# Patient Record
Sex: Male | Born: 1950 | Race: Black or African American | Hispanic: No | Marital: Married | Smoking: Former smoker
Health system: Southern US, Community
[De-identification: ages and names within clinical notes are randomized; demographics above are authoritative.]

## PROBLEM LIST (undated history)

## (undated) DIAGNOSIS — Z8619 Personal history of other infectious and parasitic diseases: Secondary | ICD-10-CM

## (undated) DIAGNOSIS — I10 Essential (primary) hypertension: Secondary | ICD-10-CM

## (undated) DIAGNOSIS — G4486 Cervicogenic headache: Secondary | ICD-10-CM

## (undated) DIAGNOSIS — K5909 Other constipation: Secondary | ICD-10-CM

## (undated) DIAGNOSIS — R7303 Prediabetes: Secondary | ICD-10-CM

## (undated) DIAGNOSIS — Z973 Presence of spectacles and contact lenses: Secondary | ICD-10-CM

## (undated) DIAGNOSIS — E785 Hyperlipidemia, unspecified: Secondary | ICD-10-CM

## (undated) HISTORY — DX: Hyperlipidemia, unspecified: E78.5

## (undated) HISTORY — PX: COLONOSCOPY WITH PROPOFOL: SHX5780

## (undated) HISTORY — DX: Essential (primary) hypertension: I10

## (undated) HISTORY — PX: OTHER SURGICAL HISTORY: SHX169

---

## 2002-10-06 ENCOUNTER — Encounter: Payer: Self-pay | Admitting: Family Medicine

## 2002-10-06 ENCOUNTER — Encounter: Admission: RE | Admit: 2002-10-06 | Discharge: 2002-10-06 | Payer: Self-pay | Admitting: Family Medicine

## 2015-04-26 ENCOUNTER — Ambulatory Visit
Admission: RE | Admit: 2015-04-26 | Discharge: 2015-04-26 | Disposition: A | Discharge status: Home / Self Care | Payer: BLUE CROSS/BLUE SHIELD | Source: Ambulatory Visit | Attending: Family Medicine | Admitting: Family Medicine

## 2015-04-26 ENCOUNTER — Other Ambulatory Visit: Payer: Self-pay | Admitting: Family Medicine

## 2015-04-26 DIAGNOSIS — R109 Unspecified abdominal pain: Secondary | ICD-10-CM

## 2015-08-15 ENCOUNTER — Other Ambulatory Visit: Payer: Self-pay | Admitting: Physician Assistant

## 2015-08-15 DIAGNOSIS — R1013 Epigastric pain: Secondary | ICD-10-CM

## 2015-08-21 ENCOUNTER — Other Ambulatory Visit: Payer: Self-pay | Admitting: Family Medicine

## 2015-08-21 DIAGNOSIS — R1084 Generalized abdominal pain: Secondary | ICD-10-CM

## 2015-08-23 ENCOUNTER — Ambulatory Visit
Admission: RE | Admit: 2015-08-23 | Discharge: 2015-08-23 | Disposition: A | Discharge status: Home / Self Care | Payer: BLUE CROSS/BLUE SHIELD | Source: Ambulatory Visit | Attending: Physician Assistant | Admitting: Physician Assistant

## 2015-08-23 ENCOUNTER — Ambulatory Visit
Admission: RE | Admit: 2015-08-23 | Discharge: 2015-08-23 | Disposition: A | Discharge status: Home / Self Care | Payer: BLUE CROSS/BLUE SHIELD | Source: Ambulatory Visit | Attending: Family Medicine | Admitting: Family Medicine

## 2015-08-23 DIAGNOSIS — R1084 Generalized abdominal pain: Secondary | ICD-10-CM

## 2015-08-23 DIAGNOSIS — R1013 Epigastric pain: Secondary | ICD-10-CM

## 2015-08-23 MED ORDER — IOPAMIDOL (ISOVUE-300) INJECTION 61%
125.0000 mL | Freq: Once | INTRAVENOUS | Status: AC | PRN
  Administered 2015-08-23: 125 mL via INTRAVENOUS

## 2016-08-12 ENCOUNTER — Encounter: Payer: Managed Care, Other (non HMO) | Attending: Family Medicine | Admitting: Registered"

## 2016-08-12 ENCOUNTER — Encounter: Payer: Self-pay | Admitting: Registered"

## 2016-08-12 DIAGNOSIS — Z713 Dietary counseling and surveillance: Secondary | ICD-10-CM | POA: Insufficient documentation

## 2016-08-12 DIAGNOSIS — R7303 Prediabetes: Secondary | ICD-10-CM | POA: Insufficient documentation

## 2016-08-12 NOTE — Progress Notes (Signed)
Medical Nutrition Therapy:  Appt start time: 0930 end time:  1030.   Assessment:  Primary concerns today: lifestyle changes to address pre-diabetes. Changes made since last appointment with doctor include increased physical activity and cutting back on carb intake. This patient is accompanied in the office by his spouse. Patient reports that he usually cooks dinner and they eat together when she gets home from work.   Preferred Learning Style:   No preference indicated   Learning Readiness:   Change in progress   MEDICATIONS: reviewed   DIETARY INTAKE:  Usual eating pattern includes 1-2 meals and 0-1 snacks per day.  24-hr recall:  B ( AM): none OR bacon eggs, sausage grits, toast with cream cheese OR cheerios, bananas Snk ( AM): none  L ( PM): none OR salad with grilled chicken Snk ( PM): pistachios D (6 PM): chicken green beans mac and cheese Snk ( PM): bananas OR yogurt with fruit Beverages: occasionally sweet tea 1x week, pepsi 2-3 times a week. Lemon water.  Usual physical activity: walking on the treadmill 2-3 week, has a fit bit to monitor steps and motivate activity.  Estimated energy needs: 2000 calories 225 g carbohydrates 150 g protein 56 g fat  Progress Towards Goal(s):  In progress.   Nutritional Diagnosis:  NI-5.8.4 Inconsistent carbohydrate intake As related to most calories and carbohydrates consumed in one meal.  As evidenced by dietary recall.    Intervention:  Nutrition Education for managing blood glucose with diet and lifestyle changes. Described diabetes. Stated some common risk factors for diabetes. Defined the role of glucose and insulin.  Identified type of diabetes and pathophysiology.  Described the relationship between diabetes and cardiovascular risk.  Described the role of different macronutrients on glucose.  Explained how carbohydrates affect blood glucose.  Stated what foods contain the most carbohydrates.  Demonstrated carbohydrate counting.   Demonstrated how to read Nutrition Facts food label. Practiced creating balanced meal.  Teaching Method Utilized:  Visual Auditory Hands on  Handouts given during visit include:  Living Well with Diabetes (copies)  Meal Plan Card  A1c handout  Portion Plate   Barriers to learning/adherence to lifestyle change: none  Demonstrated degree of understanding via:  Teach Back   Monitoring/Evaluation:  Dietary intake, exercise, and body weight prn.

## 2016-08-12 NOTE — Patient Instructions (Signed)
Plan:  Aim for 4-5 Carb Choices per meal (40-65 grams) +/- 1 either way  Aim for 1-2 Carbs per snack if hungry  Include protein in moderation with your meals and snacks Consider reading food labels for Total Carbohydrate  Continue phycial activity level daily as tolerated  Consider low sodium V-8 Consider reducing the amount of bacon in diet.  [email or web address] for restaurant nutrition facts.

## 2016-09-17 ENCOUNTER — Ambulatory Visit (INDEPENDENT_AMBULATORY_CARE_PROVIDER_SITE_OTHER): Payer: Managed Care, Other (non HMO) | Admitting: Neurology

## 2016-09-17 ENCOUNTER — Encounter: Payer: Self-pay | Admitting: Neurology

## 2016-09-17 VITALS — BP 130/82 | HR 90 | Ht 74.0 in | Wt 230.0 lb

## 2016-09-17 DIAGNOSIS — H93239 Hyperacusis, unspecified ear: Secondary | ICD-10-CM | POA: Diagnosis not present

## 2016-09-17 DIAGNOSIS — H8110 Benign paroxysmal vertigo, unspecified ear: Secondary | ICD-10-CM

## 2016-09-17 DIAGNOSIS — R42 Dizziness and giddiness: Secondary | ICD-10-CM | POA: Diagnosis not present

## 2016-09-17 DIAGNOSIS — R51 Headache: Secondary | ICD-10-CM

## 2016-09-17 DIAGNOSIS — G4486 Cervicogenic headache: Secondary | ICD-10-CM | POA: Insufficient documentation

## 2016-09-17 MED ORDER — CYCLOBENZAPRINE HCL 5 MG PO TABS: 2.5000 mg | ORAL_TABLET | Freq: Two times a day (BID) | ORAL | 5 refills | Status: DC

## 2016-09-17 NOTE — Progress Notes (Signed)
Note routed

## 2016-09-17 NOTE — Patient Instructions (Signed)
1.  Start flexeril 2.5mg  twice daily, if it does not make you too sleepy.  If you do not tolerate this, please call my office and we can switch to a different medication.  2.  Start neck physiotherapy  3.  If you develop new hearing changes or worsening headaches, call my office and we will schedule MRI brain.  Return to clinic in 4 months

## 2016-09-17 NOTE — Progress Notes (Signed)
[facility] Clinic Note - Initial Visit   Date: 09/17/16  Wise Fees MRN: [id number] DOB: [birth year]   Dear Dr. Manus Gunning:  Thank you for your kind referral of Julian Jimenez for consultation of dizziness and hyperacusis. Although his history is well known to you, please allow [place] to reiterate it for the purpose of our medical record. The patient was accompanied to the clinic by wife who also provides collateral information.     History of Present Illness: Julian Jimenez is a 66 y.o. right-handed African American male with hypertension and hyperlipidemia presenting for evaluation of dizziness.    Starting in mid-December 2017, he started having difficulty with imbalance and sensitivity to loud sounds.  For instance, if his wife scratches a lottery ticket to hits the table, the sound is very intense.  He often keeps the TV volume very low. He does not recall if hearing was affecting in both ears or lateralized to one side. This has improved over the past month and his hearing sensitivity is not as bad.  He also has spells of dizziness, which is triggered by head movement, and despite him moving, the objects in his vision keep moving.  Being still or closing his eyes, improves dizziness.  He denies nausea or vomiting.  He was given meclizine, which helps.   He does not have dizziness spell any more.   He has neck tightness especially over the right base of the head, which has been present for many years.  Pain is sore and achy and improved with neck ROM exercises.  He takes flexeril 5mg  at bedtime which helps, but makes him extremely sleepy.  Symptoms are improved with massage.  He has not tried ibuprofen or tylenol or physical therapy.  Headaches do not wake him up from sleeping or exacerbated by laughing, sneezing, or bearing down.   Past Medical History:  Diagnosis Date  . Hyperlipidemia   . Hypertension     Past Surgical History:  Procedure Laterality Date   . None       Medications:  Outpatient Encounter Prescriptions as of 09/17/2016  Medication Sig  . aspirin 81 MG chewable tablet Chew by mouth daily.  . cyclobenzaprine (FLEXERIL) 10 MG tablet Take 10 mg by mouth 3 (three) times daily as needed for muscle spasms.  Marland Kitchen lisinopril-hydrochlorothiazide (PRINZIDE,ZESTORETIC) 20-12.5 MG tablet Take 1 tablet by mouth daily.  . meclizine (ANTIVERT) 25 MG tablet   . simvastatin (ZOCOR) 20 MG tablet Take 20 mg by mouth daily.   No facility-administered encounter medications on file as of 09/17/2016.      Allergies: Not on File  Family History: Family History  Problem Relation Age of Onset  . Diabetes Mellitus II Mother   . Hypertension Mother   . Heart disease Father   . Diabetes Mellitus II Sister   . Hypertension Brother   . Hyperlipidemia Brother     Social History: Social History  Substance Use Topics  . Smoking status: Former Games developer  . Smokeless tobacco: Never Used  . Alcohol use Yes     Comment: One beer daily.    Social History   Social History Narrative   Retired Naval architect.   Lives with wife.  They have three children.   Highest level of education:  12th grade    Review of Systems:  CONSTITUTIONAL: No fevers, chills, night sweats, or weight loss.   EYES: No visual changes or eye pain ENT: No hearing changes.  No history of nose  bleeds.   RESPIRATORY: No cough, wheezing and shortness of breath.   CARDIOVASCULAR: Negative for chest pain, and palpitations.   GI: Negative for abdominal discomfort, blood in stools or black stools.  No recent change in bowel habits.   GU:  No history of incontinence.   MUSCLOSKELETAL: No history of joint pain or swelling.  No myalgias.   SKIN: Negative for lesions, rash, and itching.   HEMATOLOGY/ONCOLOGY: Negative for prolonged bleeding, bruising easily, and swollen nodes.  No history of cancer.   ENDOCRINE: Negative for cold or heat intolerance, polydipsia or goiter.   PSYCH:  No  depression or anxiety symptoms.   NEURO: As Above.   Vital Signs:  BP 130/82   Pulse 90   Ht 6\' 2"  (1.88 m)   Wt 230 lb (104.3 kg)   BMI 29.53 kg/m  Pain Scale: 0 on a scale of 0-10   General Medical Exam:   General:  Well appearing, comfortable.   Eyes/ENT: see cranial nerve examination.   Neck: No masses appreciated.  Full range of motion without tenderness.  No carotid bruits.  There is tenderness with palpation over the cervical paraspinal muscles.  Respiratory:  Clear to auscultation, good air entry bilaterally.   Cardiac:  Regular rate and rhythm, no murmur.   Extremities:  No deformities, edema, or skin discoloration.  Skin:  No rashes or lesions.  Neurological Exam: MENTAL STATUS including orientation to time, place, person, recent and remote memory, attention span and concentration, language, and fund of knowledge is normal.  Speech is not dysarthric.  CRANIAL NERVES: II:  No visual field defects.  Unremarkable fundi.   III-IV-VI: Pupils equal round and reactive to light.  Normal conjugate, extra-ocular eye movements in all directions of gaze.  No nystagmus.  No ptosis.  Head thrust negative. V:  Normal facial sensation.   VII:  Normal facial symmetry and movements.  No pathologic facial reflexes.  VIII:  Normal hearing and vestibular function.  Weber and Rinne testing is normal. IX-X:  Normal palatal movement.   XI:  Normal shoulder shrug and head rotation.   XII:  Normal tongue strength and range of motion, no deviation or fasciculation.  MOTOR:  Motor strength is 5/5 throughout.  No atrophy, fasciculations or abnormal movements.  No pronator drift.  Tone is normal.    MSRs:  Right                                                                 Left brachioradialis 2+  brachioradialis 2+  biceps 2+  biceps 2+  triceps 2+  triceps 2+  patellar 1+  patellar 1+  ankle jerk 1+  ankle jerk 1+  Hoffman no  Hoffman no  plantar response down  plantar response down    SENSORY:  Normal and symmetric perception of light touch, pinprick, vibration, and proprioception.  Romberg's sign absent.   COORDINATION/GAIT: Normal finger-to- nose-finger.  Intact rapid alternating movements bilaterally.  Able to rise from a chair without using arms.  Gait narrow based and stable. Tandem and stressed gait intact.    IMPRESSION: 1.  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  He is most bothered by his ongoing neck discomfort and stiffness.  He currently takes  flexeril 5mg  at bedtime, but is experiencing sedation so cannot increase the frequency.  I recommend that he start taking flexeril 2.5mg  twice daily and see if we can get away with less cognitive side effects.  He will also be started with neck physiotherapy.  2.  Benign paroxsymal positional vertigo is most likely given that occurs in spells, worse with head movements, and improves with meclizine.  His hyperacusis may be associated with vestibular disorder, such as BPPV.  Fortunately, he is no longer having these spells and is asymptomatic.  We discussed getting a MRI brain to be sure there is no intracranial pathology, but given that he is asymptomatic, the likelihood of a space-occupying lesion is low.  It was decided to hold on any imaging for now and if he developed new neurological symptoms, imaging can be ordered.   Return to clinic in 4 months.   The duration of this appointment visit was 50 minutes of face-to-face time with the patient.  Greater than 50% of this time was spent in counseling, explanation of diagnosis, planning of further management, and coordination of care.   Thank you for allowing me to participate in patient's care.  If I can answer any additional questions, I would be pleased to do so.    Sincerely,    Hamlet Lasecki K. Allena Katz, DO

## 2016-09-29 IMAGING — CR DG ABDOMEN 2V
2 series · 2 of 2 positions shown · non-contrast
Comparison: None

CLINICAL DATA: Upper abdominal and epigastric pain for 1 month,
decreased bowel movement frequency

EXAM:
ABDOMEN - 2 VIEW

[w abdomen upright *]
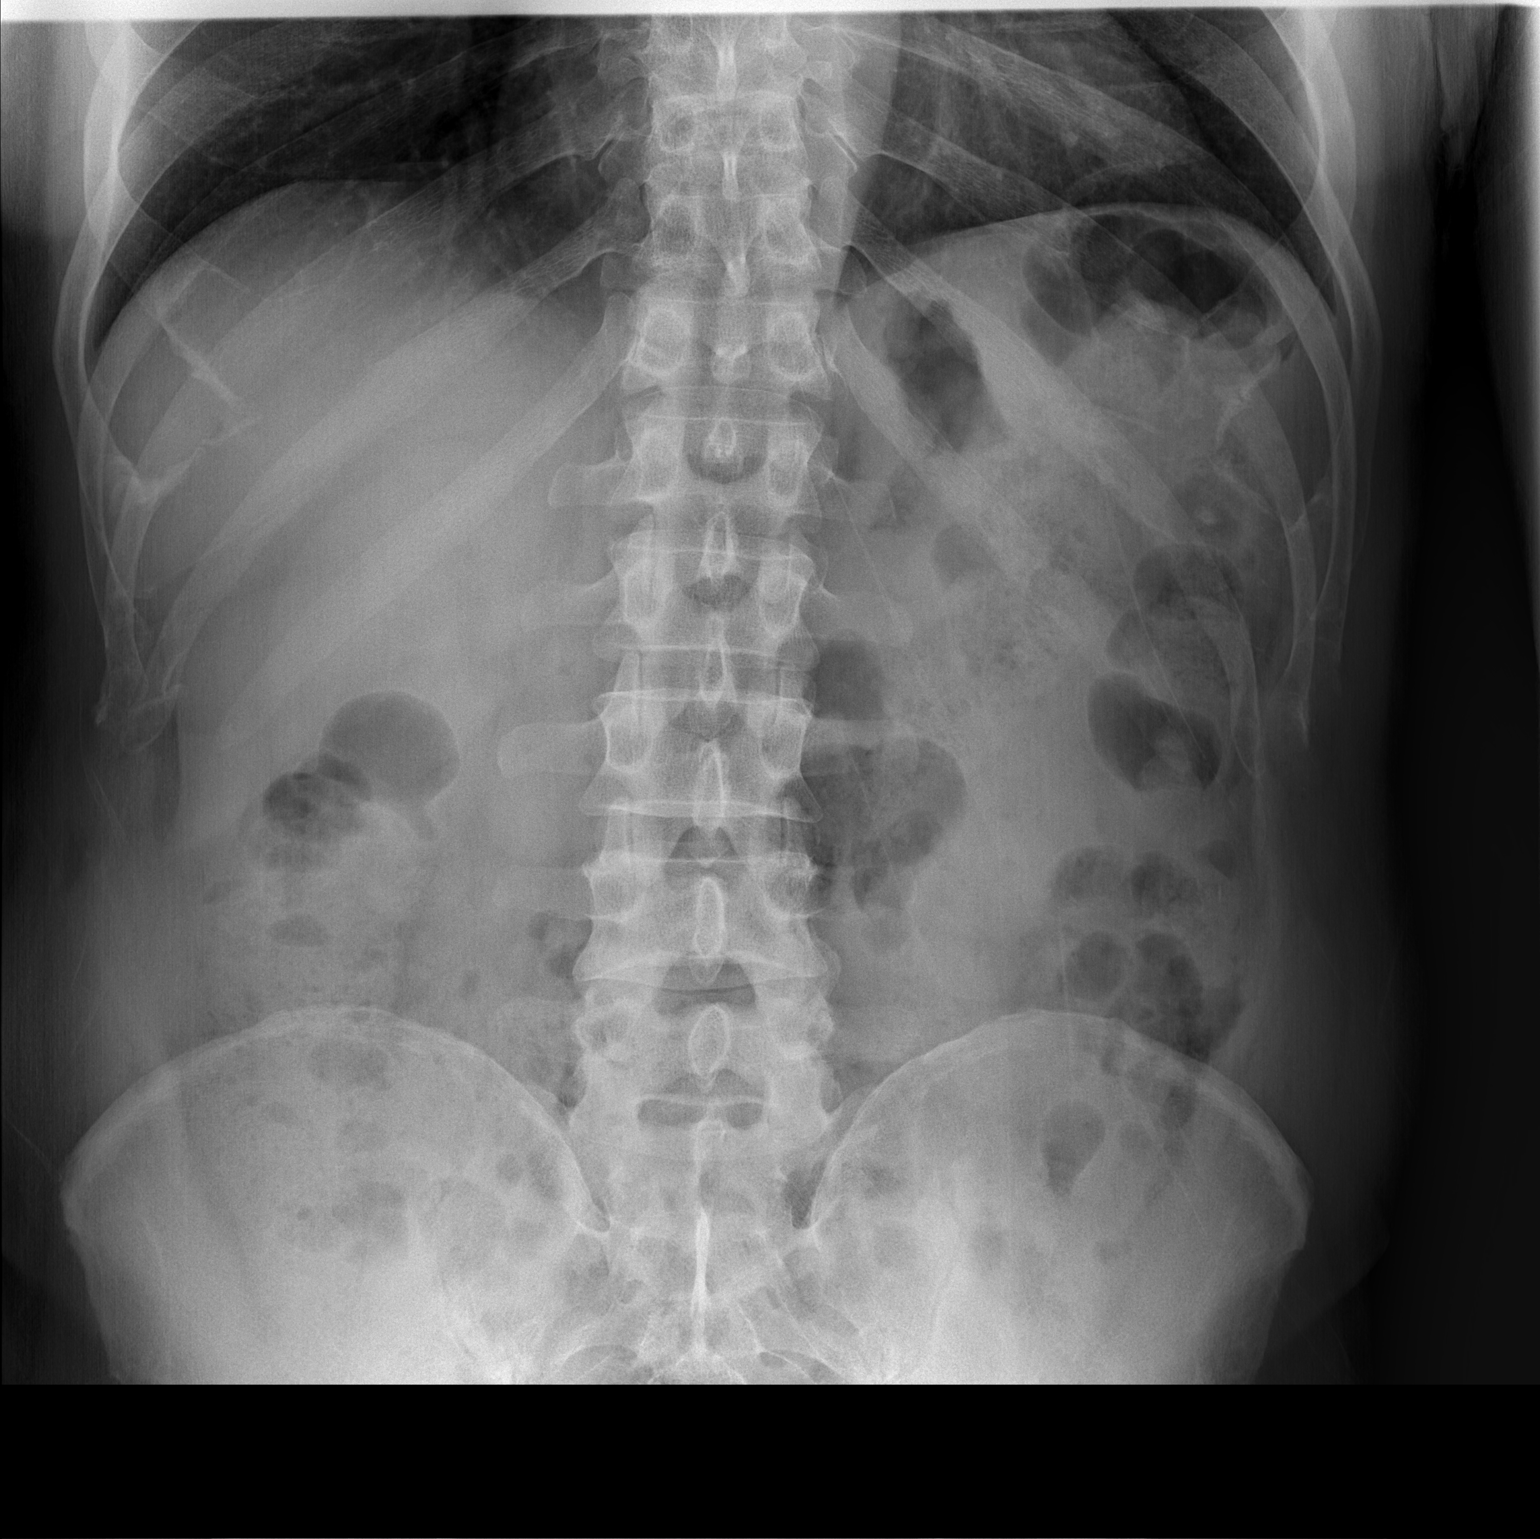

[t abdomen supine]
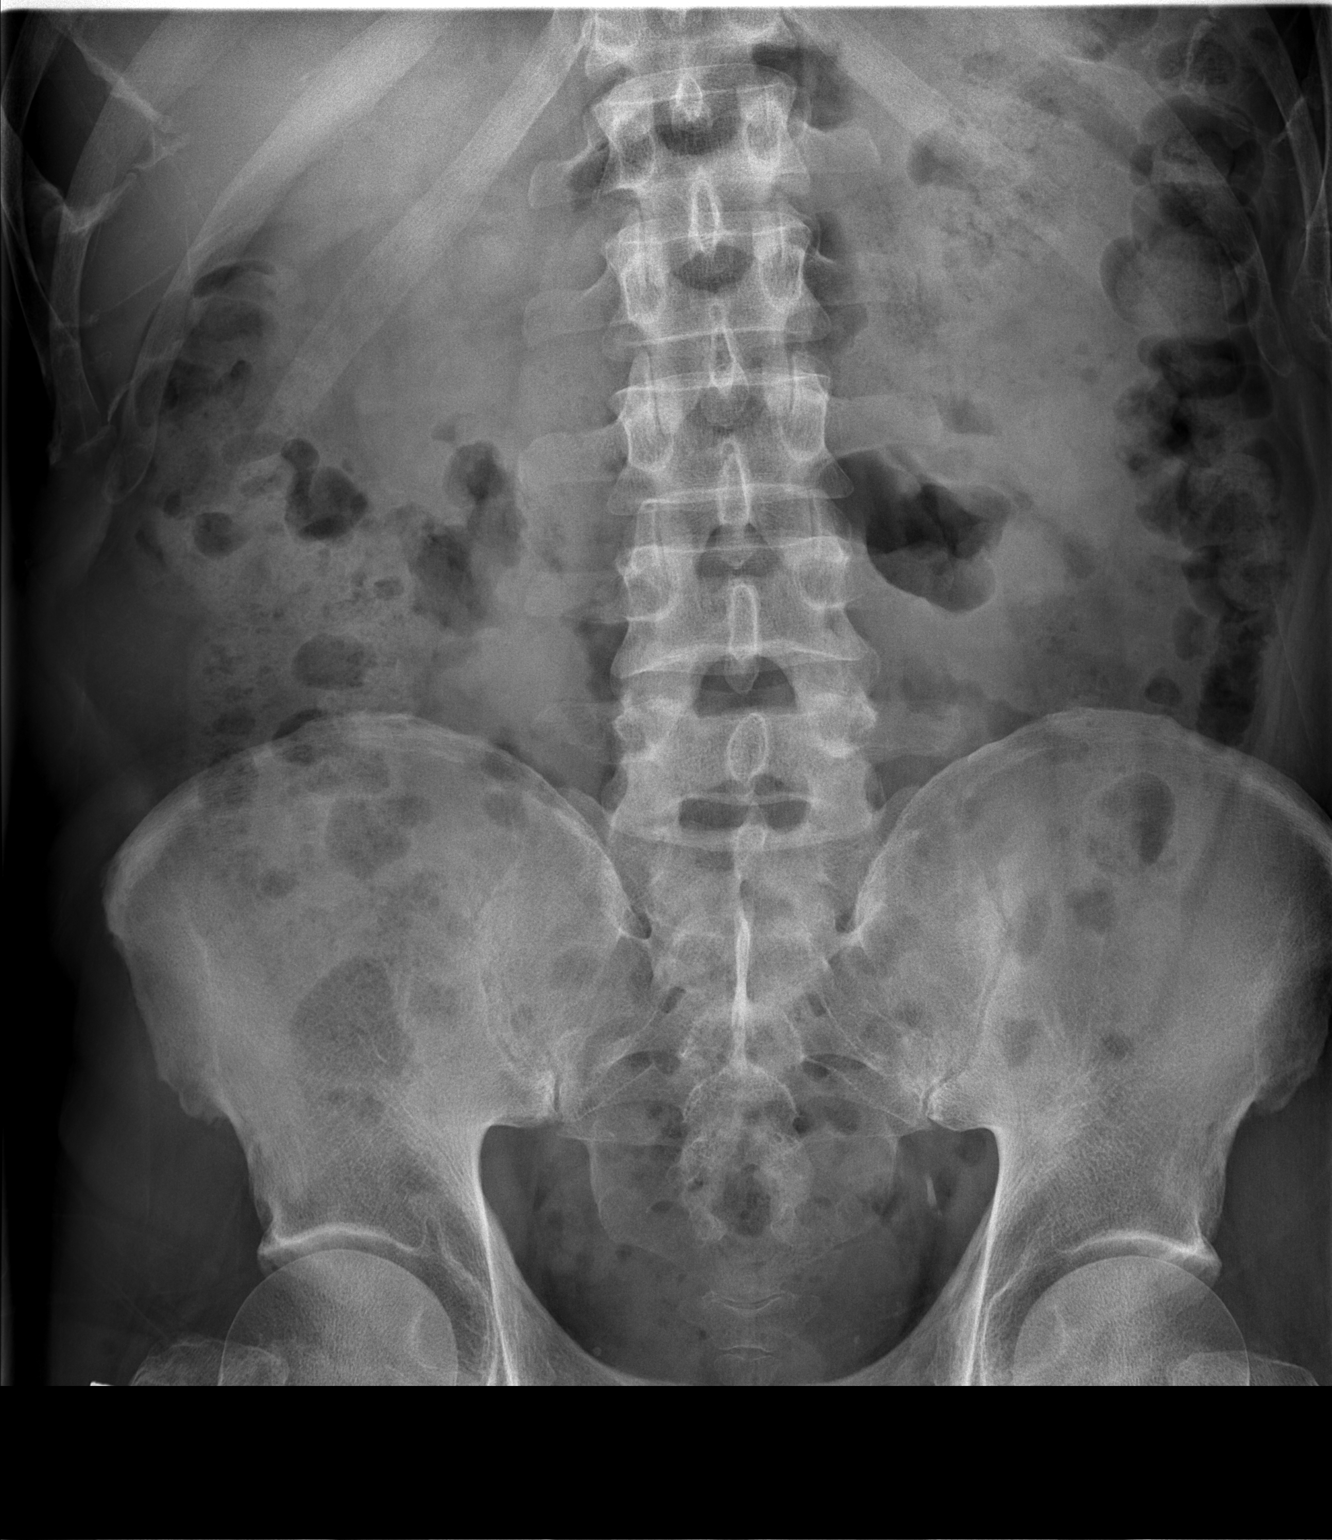

[2 of 2 positions shown; findings below may reference images not displayed]

FINDINGS: Normal bowel gas pattern.

No bowel dilatation, bowel wall thickening or free intraperitoneal
air.

Normal stool burden.

Lung bases clear.

Osseous structures normal.

No urinary tract calcifications.
IMPRESSION: Normal exam.

## 2017-01-14 ENCOUNTER — Ambulatory Visit (INDEPENDENT_AMBULATORY_CARE_PROVIDER_SITE_OTHER): Payer: Managed Care, Other (non HMO) | Admitting: Neurology

## 2017-01-14 ENCOUNTER — Encounter: Payer: Self-pay | Admitting: Neurology

## 2017-01-14 VITALS — BP 130/86 | HR 69 | Ht 74.0 in | Wt 228.2 lb

## 2017-01-14 DIAGNOSIS — R51 Headache: Secondary | ICD-10-CM

## 2017-01-14 DIAGNOSIS — H8110 Benign paroxysmal vertigo, unspecified ear: Secondary | ICD-10-CM | POA: Diagnosis not present

## 2017-01-14 DIAGNOSIS — G4486 Cervicogenic headache: Secondary | ICD-10-CM

## 2017-01-14 NOTE — Progress Notes (Signed)
Follow-up Visit   Date: 01/14/17    Julian SquibbWardell Blundell MRN: [id number] DOB: September 11, 1950   Interim History: Julian Jimenez is a 66 y.o. right-handed  right-handed African American male with hypertension and hyperlipidemia returning to the clinic for follow-up of cervicogenic dizziness.  The patient was accompanied to the clinic by self.  History of present illness: Initial visit 09/17/2016 for dizziness:  Starting in mid-December 2017, he started having difficulty with imbalance and sensitivity to loud sounds.  For instance, if his wife scratches a lottery ticket to hits the table, the sound is very intense.  He often keeps the TV volume very low. He does not recall if hearing was affecting in both ears or lateralized to one side. This has improved over the past month and his hearing sensitivity is not as bad.  He also has spells of dizziness, which is triggered by head movement, and despite him moving, the objects in his vision keep moving.  Being still or closing his eyes, improves dizziness.  He denies nausea or vomiting.  He was given meclizine, which helps.   He does not have dizziness spell any more.   He has neck tightness especially over the right base of the head, which has been present for many years.  Pain is sore and achy and improved with neck ROM exercises.  He takes flexeril 5mg  at bedtime which helps, but makes him extremely sleepy.  Symptoms are improved with massage.  He has not tried ibuprofen or tylenol or physical therapy.  Headaches do not wake him up from sleeping or exacerbated by laughing, sneezing, or bearing down.  UPDATE 01/14/2017:  He is here for 4 month follow-up.  He has been taking flexeril 2.5mg  at bedtime and was able to reduce the frequency to every 3 days and noticed that headaches and dizziness are improved, but he continues to have some mild stiffness of the neck. He did not receive a call for neck PT, so did not go, but nevertheless, is doing much better.  No  new complaints. He has not had any more spells of vertigo.  Medications:  Current Outpatient Prescriptions on File Prior to Visit  Medication Sig Dispense Refill  . aspirin 81 MG chewable tablet Chew by mouth daily.    . cyclobenzaprine (FLEXERIL) 5 MG tablet Take 0.5 tablets (2.5 mg total) by mouth 2 (two) times daily. 30 tablet 5  . lisinopril-hydrochlorothiazide (PRINZIDE,ZESTORETIC) 20-12.5 MG tablet Take 1 tablet by mouth daily.    . meclizine (ANTIVERT) 25 MG tablet     . simvastatin (ZOCOR) 20 MG tablet Take 20 mg by mouth daily.     No current facility-administered medications on file prior to visit.     Allergies: Not on File  Review of Systems:  CONSTITUTIONAL: No fevers, chills, night sweats, or weight loss.  EYES: No visual changes or eye pain ENT: No hearing changes.  No history of nose bleeds.   RESPIRATORY: No cough, wheezing and shortness of breath.   CARDIOVASCULAR: Negative for chest pain, and palpitations.   GI: Negative for abdominal discomfort, blood in stools or black stools.  No recent change in bowel habits.   GU:  No history of incontinence.   MUSCLOSKELETAL: No history of joint pain or swelling.  No myalgias.   SKIN: Negative for lesions, rash, and itching.   ENDOCRINE: Negative for cold or heat intolerance, polydipsia or goiter.   PSYCH:  No depression or anxiety symptoms.   NEURO: As Above.  Vital Signs:  BP 130/86   Pulse 69   Ht 6\' 2"  (1.88 m)   Wt 228 lb 3 oz (103.5 kg)   SpO2 98%   BMI 29.30 kg/m   Neurological Exam: MENTAL STATUS including orientation to time, place, person, recent and remote memory, attention span and concentration, language, and fund of knowledge is normal.  Speech is not dysarthric.  CRANIAL NERVES:  Pupils equal round and reactive to light.  Normal conjugate, extra-ocular eye movements in all directions of gaze.  No ptosis or nystagmus. Face is symmetric.   MOTOR:  Motor strength is 5/5 in all  extremities  COORDINATION/GAIT:  Normal finger-to- nose-finger.  Intact rapid alternating movements bilaterally.  Gait narrow based and stable.   Data: n/a  IMPRESSION/PLAN: 1.  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  Clinically, improved with low dose flexeril 2.5mg  at bedtime.  Since he is tolerating this better, he may increase this to 5mg  at bedtime as needed.  2.  Benign paroxsymal positional vertigo is most likely given that occurs in spells, worse with head movements, and improves with meclizine.  His hyperacusis may be associated with vestibular disorder, such as BPPV.  Fortunately, he is no longer having these spells   Return to clinic as needed  The duration of this appointment visit was 15 minutes of face-to-face time with the patient.  Greater than 50% of this time was spent in counseling, explanation of diagnosis, planning of further management, and coordination of care.   Thank you for allowing me to participate in patient's care.  If I can answer any additional questions, I would be pleased to do so.    Sincerely,    Burnell Matlin K. Allena Katz, DO

## 2017-01-14 NOTE — Patient Instructions (Signed)
Take flexeril 5mg  at bedtime as needed for neck pain and muscle stiffness  Return to clinic as needed

## 2017-01-26 IMAGING — CT CT ABD-PELV W/ CM
3 of 5 series · 13 of 36 positions shown, 19 images · IV contrast (READICAT/WATER & [ID] ISOVUE 300)
Comparison: Abdominal ultrasound 08/23/2015.

CLINICAL DATA: Lower pelvic pain 3 weeks with low-grade fever.

EXAM:
CT ABDOMEN AND PELVIS WITH CONTRAST
TECHNIQUE: Multidetector CT imaging of the abdomen and pelvis was performed
using the standard protocol following bolus administration of
intravenous contrast.
CONTRAST:  125mL 8YLK4I-GRR IOPAMIDOL (8YLK4I-GRR) INJECTION 61%

[Series 3: abd/pelvis with · axial · 0.76mm/px · z∈[-368,-48]mm · 7 of 84 slices shown, 12 images]
[im 11/84  soft-tissue]
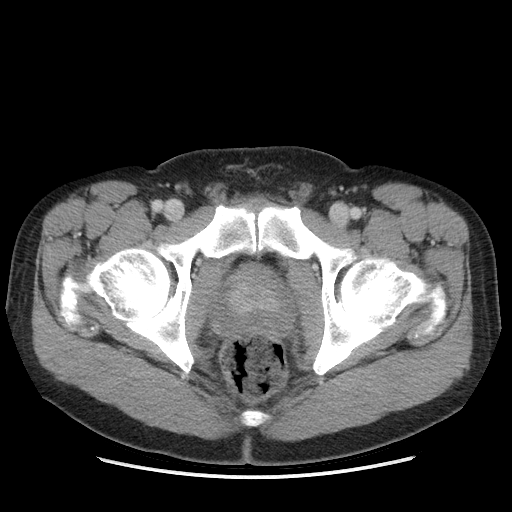
[im 11/84  bone]
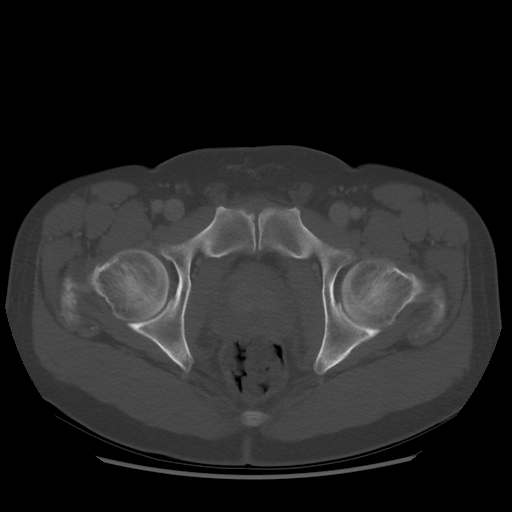
[im 21/84  soft-tissue]
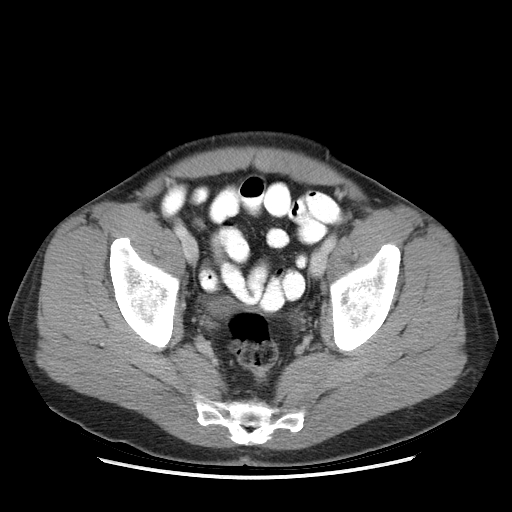
[im 32/84  soft-tissue]
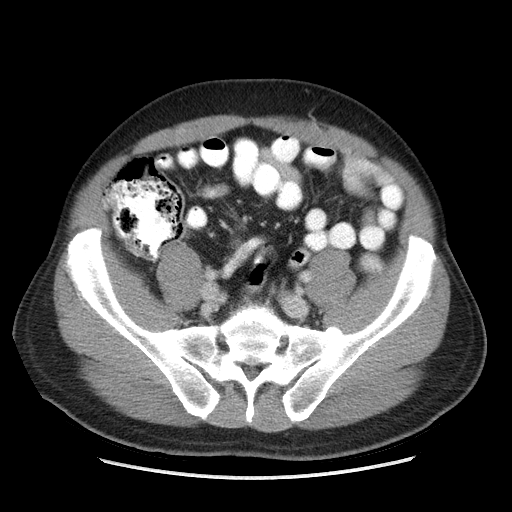
[im 42/84  soft-tissue]
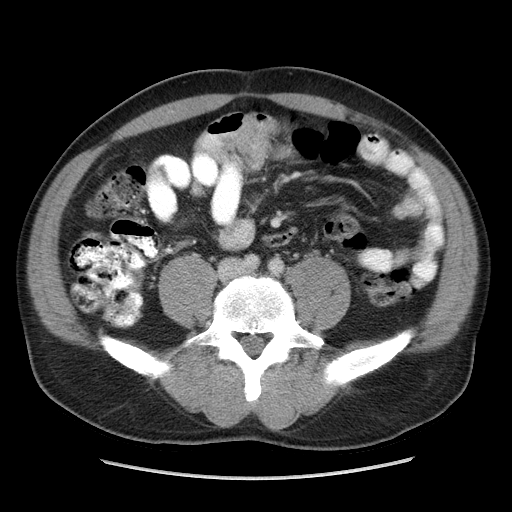
[im 42/84  lung]
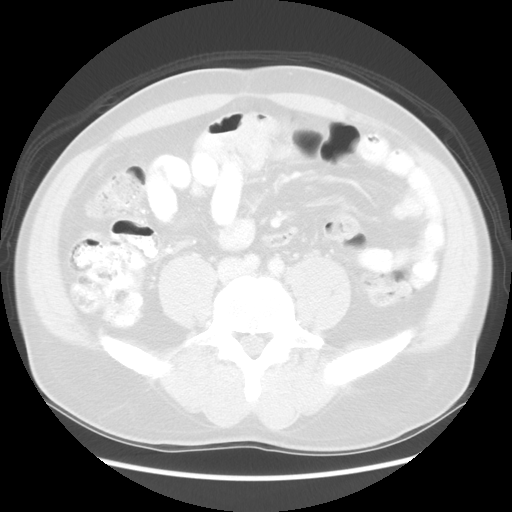
[im 52/84  soft-tissue]
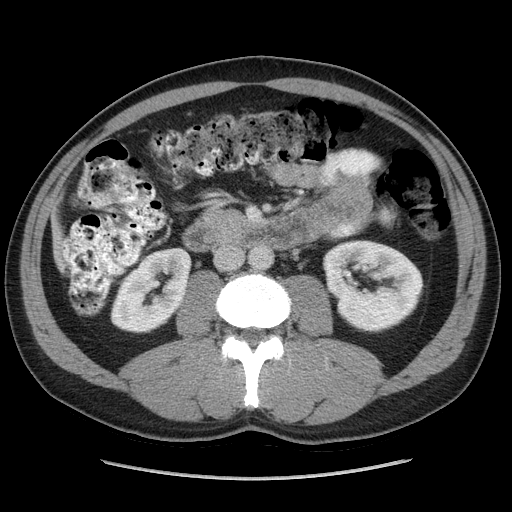
[im 52/84  lung]
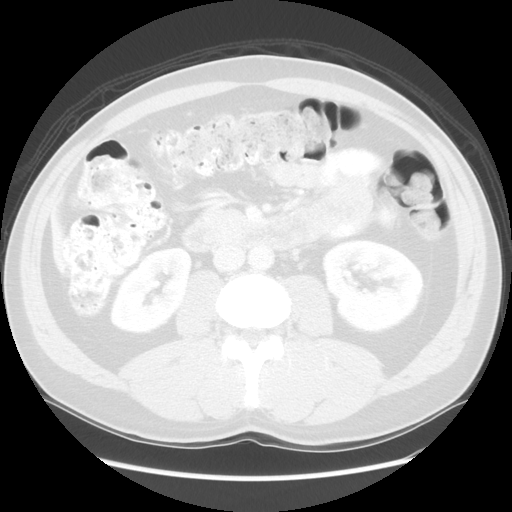
[im 63/84  soft-tissue]
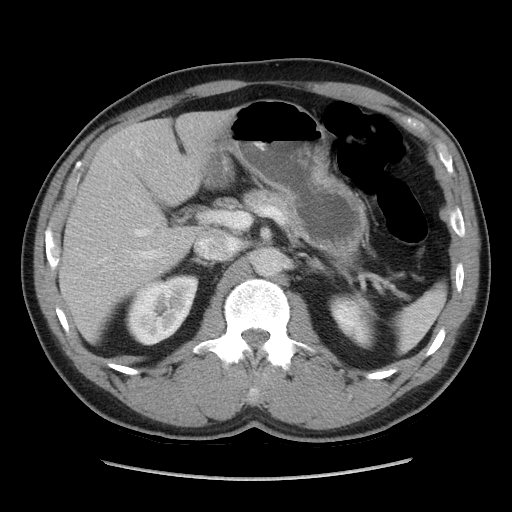
[im 63/84  lung]
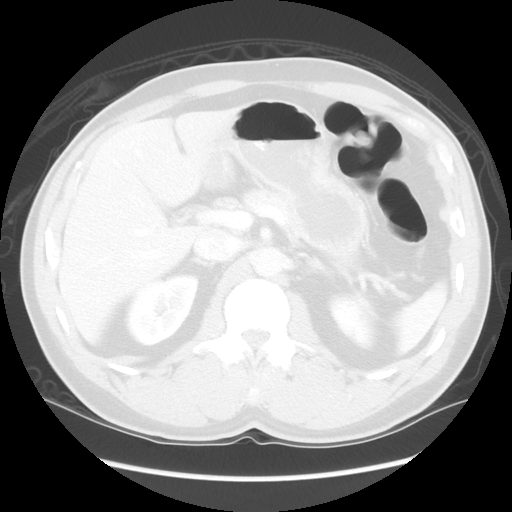
[im 73/84  soft-tissue]
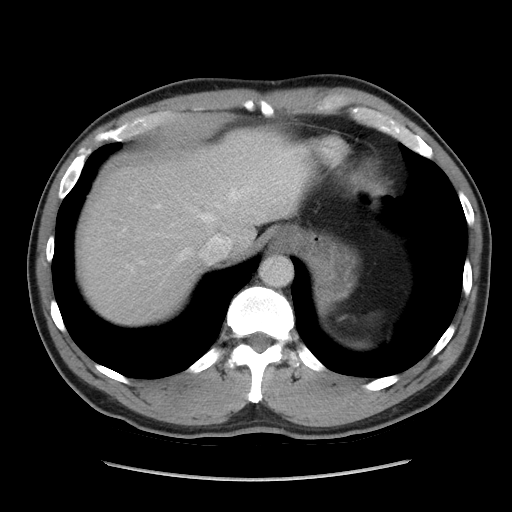
[im 73/84  lung]
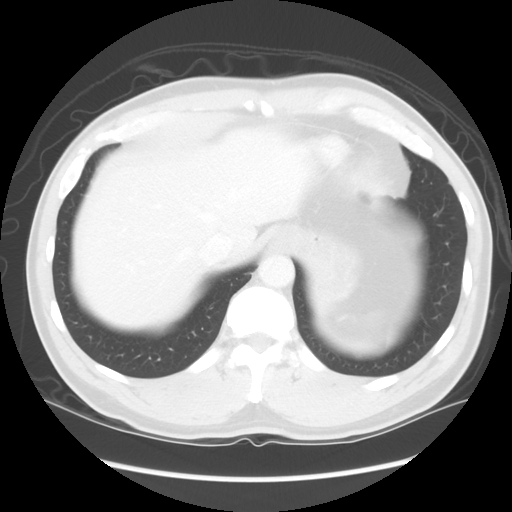

[Series 601: coronal body · coronal · 0.91mm/px · 1 of 129 slices shown, 2 images]
[im 43/129  soft-tissue]
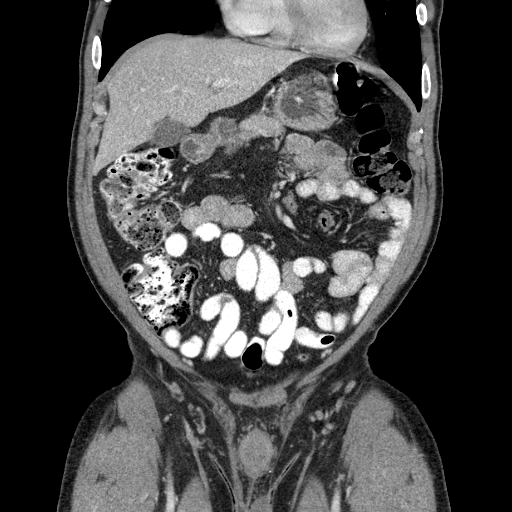
[im 43/129  bone]
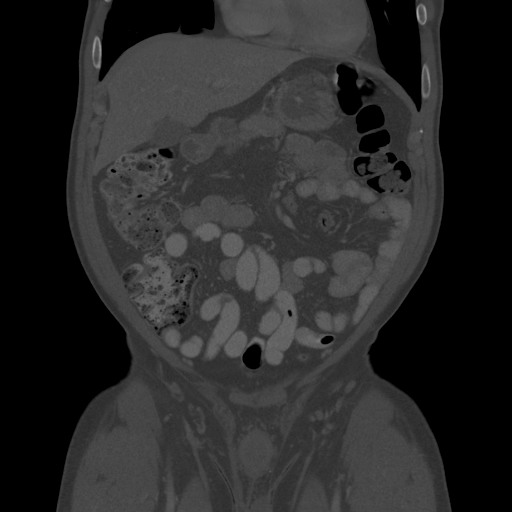

[Series 602: sagittal body · sagittal · 0.91mm/px · 5 of 156 slices shown]
[im 10/156  soft-tissue]
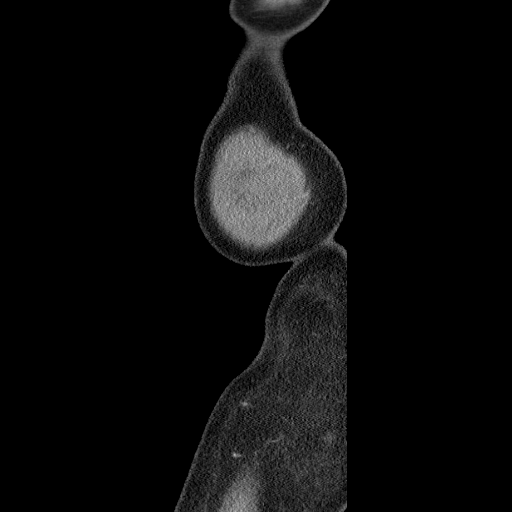
[im 30/156  soft-tissue]
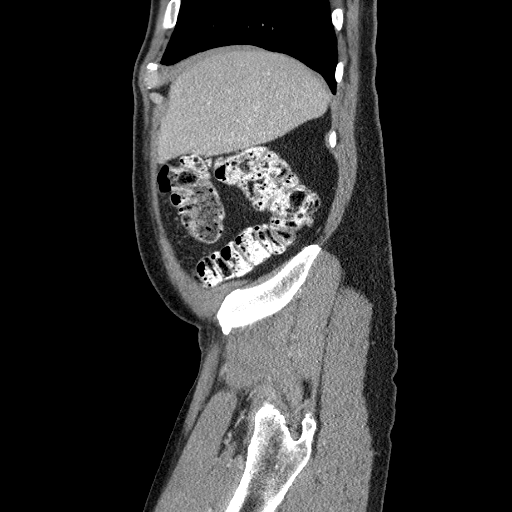
[im 49/156  soft-tissue]
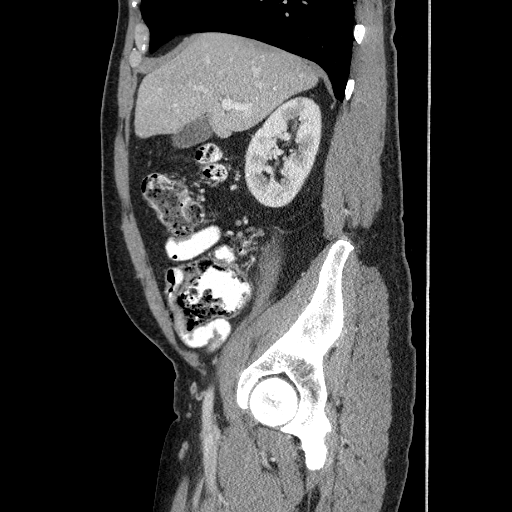
[im 68/156  soft-tissue]
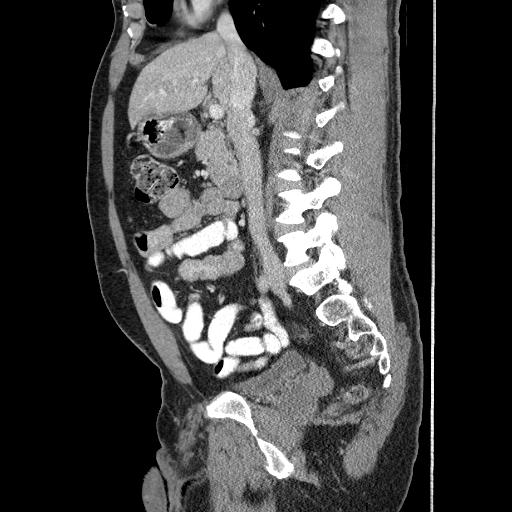
[im 88/156  soft-tissue]
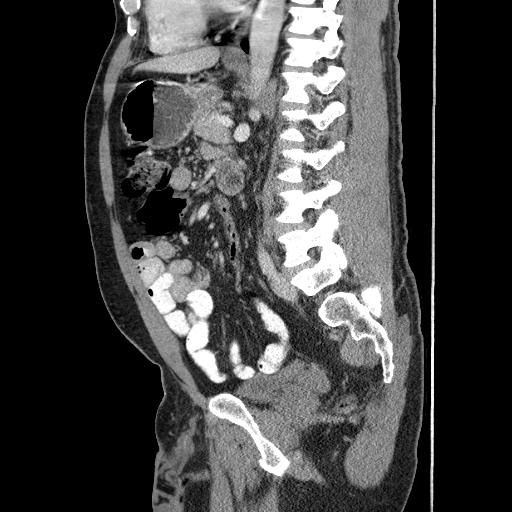

[13 of 36 positions shown; findings below may reference images not displayed]

FINDINGS: Lung bases are normal.

Abdominal images demonstrate a normal liver, spleen, pancreas,
gallbladder and adrenal glands. Stomach is within normal.

Kidneys are normal in size, shape and position without
hydronephrosis or nephrolithiasis. There are a few small sub cm
renal cortical hypodensities too small to characterize but likely
cysts. Ureters are within normal.

Vascular structures are within normal. Mesentery is within normal.
There is no free fluid or focal inflammatory change.

Appendix is normal.  Colon small bowel are within normal.

Pelvic images demonstrate mild prominence of the prostate gland. The
bladder and rectum are normal. There is no adenopathy or free fluid.
IMPRESSION: No acute findings in the abdomen/pelvis.

Few small bilateral sub cm renal cortical hypodensities too small to
characterize but likely cysts.

Mild prominence of the prostate gland.

Remaining bones and soft tissues are within normal.

## 2017-03-30 ENCOUNTER — Other Ambulatory Visit: Payer: Self-pay | Admitting: Neurology

## 2017-09-08 ENCOUNTER — Other Ambulatory Visit: Payer: Self-pay | Admitting: Neurology

## 2018-03-01 ENCOUNTER — Telehealth: Payer: Self-pay | Admitting: Neurology

## 2018-03-01 NOTE — Telephone Encounter (Signed)
Please advise 

## 2018-03-01 NOTE — Telephone Encounter (Signed)
Last seen in June 2018.  He needs to be seen in the clinic to be reassessed.

## 2018-03-01 NOTE — Telephone Encounter (Signed)
Patient's wife called wanting to know if he could get a refill on his medication and also increase the dosage as this medication is not helping him that much. It is for the Cyclobenzaprine 5mg . They use the [facility] on [address] in [place]. If you need to call her back it's [phone]. Thanks.

## 2018-03-02 NOTE — Telephone Encounter (Signed)
Patient is coming in on 03-11-18 at 3:30.

## 2018-03-11 ENCOUNTER — Ambulatory Visit (INDEPENDENT_AMBULATORY_CARE_PROVIDER_SITE_OTHER): Payer: Medicare Other | Admitting: Neurology

## 2018-03-11 ENCOUNTER — Encounter: Payer: Self-pay | Admitting: Neurology

## 2018-03-11 VITALS — BP 130/70 | HR 74 | Ht 74.5 in | Wt 232.0 lb

## 2018-03-11 DIAGNOSIS — M542 Cervicalgia: Secondary | ICD-10-CM | POA: Diagnosis not present

## 2018-03-11 DIAGNOSIS — R51 Headache: Secondary | ICD-10-CM | POA: Diagnosis not present

## 2018-03-11 DIAGNOSIS — G4486 Cervicogenic headache: Secondary | ICD-10-CM

## 2018-03-11 MED ORDER — CYCLOBENZAPRINE HCL 5 MG PO TABS: ORAL_TABLET | ORAL | 3 refills | Status: DC

## 2018-03-11 NOTE — Patient Instructions (Addendum)
Start neck physical therapy. You have been referred to [facility] for [facility] therapy. They will call you directly to schedule an appointment.  Please call [phone] if you do not hear from them.   Continue cyclobenzaprine 5mg  at bedtime.  OK to take extra dose as needed  Return to clinic in 6 months

## 2018-03-11 NOTE — Progress Notes (Signed)
Follow-up Visit   Date: 03/11/18    Julian Jimenez MRN: [id number] DOB: 10-29-1950   Interim History: Julian Jimenez is a 67 y.o. right-handed  right-handed African American male with hypertension and hyperlipidemia returning to the clinic for follow-up of cervicogenic dizziness.  The patient was accompanied to the clinic by self.  History of present illness: Initial visit 09/17/2016 for dizziness:  Starting in mid-December 2017, he started having difficulty with imbalance and sensitivity to loud sounds.  For instance, if his wife scratches a lottery ticket to hits the table, the sound is very intense.  He often keeps the TV volume very low. He does not recall if hearing was affecting in both ears or lateralized to one side. This has improved over the past month and his hearing sensitivity is not as bad.  He also has spells of dizziness, which is triggered by head movement, and despite him moving, the objects in his vision keep moving.  Being still or closing his eyes, improves dizziness.  He denies nausea or vomiting.  He was given meclizine, which helps.   He does not have dizziness spell any more.   He has neck tightness especially over the right base of the head, which has been present for many years.  Pain is sore and achy and improved with neck ROM exercises.  He takes flexeril 5mg  at bedtime which helps, but makes him extremely sleepy.  Symptoms are improved with massage.  He has not tried ibuprofen or tylenol or physical therapy.  Headaches do not wake him up from sleeping or exacerbated by laughing, sneezing, or bearing down.  UPDATE 01/14/2017:  He is here for 4 month follow-up.  He has been taking flexeril 2.5mg  at bedtime and was able to reduce the frequency to every 3 days and noticed that headaches and dizziness are improved, but he continues to have some mild stiffness of the neck. He did not receive a call for neck PT, so did not go, but nevertheless, is doing much better.  No  new complaints. He has not had any more spells of vertigo.  UPDATE 03/11/2018:   He is due for refills on cyclobenzaprine 5mg  at bedtime.  He is mostly bothered by neck stiffness and achy pain.  He denies arm weakness or radiating numbness/tingling.  He had benefit with cyclobenzaprine 5mg  at bedtime, but still has nights when his neck can be painful.  He would like to increase the dose. He was referred to PT last year, but was not contacted to schedule an appointment.    Medications:  Current Outpatient Medications on File Prior to Visit  Medication Sig Dispense Refill  . aspirin 81 MG chewable tablet Chew by mouth daily.    Marland Kitchen lisinopril-hydrochlorothiazide (PRINZIDE,ZESTORETIC) 20-12.5 MG tablet Take 1 tablet by mouth daily.    . meclizine (ANTIVERT) 25 MG tablet     . simvastatin (ZOCOR) 20 MG tablet Take 20 mg by mouth daily.     No current facility-administered medications on file prior to visit.     Allergies: Not on File  Review of Systems:  CONSTITUTIONAL: No fevers, chills, night sweats, or weight loss.  EYES: No visual changes or eye pain ENT: No hearing changes.  No history of nose bleeds.   RESPIRATORY: No cough, wheezing and shortness of breath.   CARDIOVASCULAR: Negative for chest pain, and palpitations.   GI: Negative for abdominal discomfort, blood in stools or black stools.  No recent change in bowel habits.  GU:  No history of incontinence.   MUSCLOSKELETAL: No history of joint pain or swelling.  No myalgias.   SKIN: Negative for lesions, rash, and itching.   ENDOCRINE: Negative for cold or heat intolerance, polydipsia or goiter.   PSYCH:  No depression or anxiety symptoms.   NEURO: As Above.   Vital Signs:  BP 130/70   Pulse 74   Ht 6' 2.5" (1.892 m)   Wt 232 lb (105.2 kg)   SpO2 98%   BMI 29.39 kg/m   General Medical Exam:   General:  Well appearing, comfortable  Eyes/ENT: see cranial nerve examination.   Neck:   Reduced neck extension and rotation.   No carotid bruits. Respiratory:  Clear to auscultation, good air entry bilaterally.   Cardiac:  Regular rate and rhythm, no murmur.   Ext:  No edema  Neurological Exam: MENTAL STATUS including orientation to time, place, person, recent and remote memory, attention span and concentration, language, and fund of knowledge is normal.  Speech is not dysarthric.  CRANIAL NERVES:  Pupils equal round and reactive to light.  Normal conjugate, extra-ocular eye movements in all directions of gaze.  No ptosis or nystagmus. Face is symmetric.   MOTOR:  Motor strength is 5/5 in all extremities.  Increased neck paraspinal muscle tension.     COORDINATION/GAIT:  Normal finger-to- nose-finger.  Intact rapid alternating movements bilaterally.  Gait narrow based and stable.   Data: n/a  IMPRESSION/PLAN: Cervicalgia.  Headaches are improved on muscle relaxants but he continues to have neck stiffness and reduced ROM.   - Continue cyclobenzaprine 5mg  at bedtime.  OK to take extra dose as needed  - Start neck PT  Return to clinic as needed     Thank you for allowing me to participate in patient's care.  If I can answer any additional questions, I would be pleased to do so.    Sincerely,    Sasuke Yaffe K. Allena KatzPatel, DO

## 2018-03-25 ENCOUNTER — Encounter: Payer: Self-pay | Admitting: Neurology

## 2018-08-11 ENCOUNTER — Ambulatory Visit: Payer: Medicare Other | Attending: Family Medicine | Admitting: Physical Therapy

## 2018-08-11 ENCOUNTER — Encounter: Payer: Self-pay | Admitting: Physical Therapy

## 2018-08-11 DIAGNOSIS — M542 Cervicalgia: Secondary | ICD-10-CM | POA: Insufficient documentation

## 2018-08-11 DIAGNOSIS — R293 Abnormal posture: Secondary | ICD-10-CM | POA: Diagnosis not present

## 2018-08-11 NOTE — Therapy (Signed)
[facility] [address] [place], [place], [address] Phone: [phone]   Fax:  [phone]  Physical Therapy Evaluation  Patient Details  Name: Julian Jimenez MRN: [id number] Date of Birth: April 17, 1951 Referring Provider (PT): Nita Sickleonika Patel DO   Encounter Date: 08/11/2018  PT End of Session - 08/11/18 1620    Visit Number  1    Number of Visits  4    Date for PT Re-Evaluation  09/10/18    Authorization Type  UHC    PT Start Time  1530    PT Stop Time  1615    PT Time Calculation (min)  45 min    Activity Tolerance  Patient tolerated treatment well    Behavior During Therapy  [facility] for tasks assessed/performed       Past Medical History:  Diagnosis Date  . Hyperlipidemia   . Hypertension     Past Surgical History:  Procedure Laterality Date  . None      There were no vitals filed for this visit.   Subjective Assessment - 08/11/18 1615    Patient is accompained by:  Family member    Pertinent History  hx of HTN    How long can you sit comfortably?  no limit    How long can you stand comfortably?  no limit     How long can you walk comfortably?  as needed     Diagnostic tests  none    Patient Stated Goals  get neck feeling better; get off using the muscle relaxor     Currently in Pain?  Yes    Pain Score  1     Pain Location  Neck    Pain Orientation  Lower    Pain Descriptors / Indicators  Aching    Pain Type  Chronic pain    Pain Radiating Towards  na    Pain Onset  More than a month ago    Pain Frequency  Intermittent    Aggravating Factors   none known    Pain Relieving Factors  muscle relaxor/ lie down     Effect of Pain on Daily Activities  not much effect     Multiple Pain Sites  No         [facility] PT Assessment - 08/11/18 0001      Assessment   Medical Diagnosis  Cervicogenic headache    Referring Provider (PT)  Nita Sickleonika Patel DO    Prior Therapy  none      Precautions   Precautions   None      Balance Screen   Has the patient fallen in the past 6 months  No      Prior Function   Level of Independence  Independent      Cognition   Overall Cognitive Status  Within Functional Limits for tasks assessed      Observation/Other Assessments   Skin Integrity  wnl      Sensation   Light Touch  Appears Intact    Proprioception  Appears Intact      Posture/Postural Control   Posture/Postural Control  Postural limitations    Postural Limitations  Forward head;Rounded Shoulders      ROM / Strength   AROM / PROM / Strength  AROM      AROM   Overall AROM   Deficits    AROM Assessment Site  Cervical    Cervical Flexion  chin to chest  Cervical Extension  45 deg from horizontal    Cervical - Right Side Bend  20 deg    Cervical - Left Side Bend  20deg    Cervical - Right Rotation  45deg    Cervical - Left Rotation  45deg      Palpation   Patella mobility  mild hypertonicity in post cervial spine      Balance   Balance Assessed  Yes      Dynamic Standing Balance   Dynamic Standing - Comments  MOD CTSIB 4/4      Functional Gait  Assessment   Gait assessed   Yes   no deficits no assisted device                Objective measurements completed on examination: See above findings.              PT Education - 08/11/18 1618    Education Details  use the hep and ice as methods to deal with neck stiffness and pain vs using meds; keep posture up straight at all times     Person(s) Educated  Patient;Spouse    Methods  Explanation;Demonstration;Verbal cues;Handout    Comprehension  Verbalized understanding;Returned demonstration       PT Short Term Goals - 08/11/18 1630      PT SHORT TERM GOAL #1   Title  Pt will be independent in HEP for cervical AAROM and stretching so he will maintain the gains made  during manual therapy     Baseline  no HEP    Time  4    Period  Weeks    Status  New      PT SHORT TERM GOAL #2   Title  Pt will be  able to completely stop use of muscle relaxor to treat his neck / headache pain     Baseline  using nightly / flexril     Time  4    Period  Weeks        PT Long Term Goals - 08/11/18 1632      PT LONG TERM GOAL #1   Title  Pt will have full resolution of neck and headache pain and have normal , pain free range of motion     Baseline  guarded head mvt and intermittent head ache and pain    Time  8    Period  Weeks             Plan - 08/11/18 1621    Clinical Impression Statement  Evaluation of cervical motion, palpation of posterior cerivcal musculature and joints and assessment of accessory mvt of cervical spine only revealed a stiffness throughtout the neck;  the stiffness improved with the SNAG glides through the neck and the use of the self mobs we sent for the HEP;  I was able to eventually work through the guarded posutre and get near normal cervical extension and rotation by the end of the session and he had a marked improvment in his AROM ; stating his neck felt real loose;  with supine to sit and even with hanging his head in extension off the mat he had no evidence of lightheadedness or vertigo. With the neck mvts, eventually throught full range, he had no radicuopathy or headaches;  Pt will benefit from developing a cervcal range of motion and strength ex's routine and we can do the manual  therapy through his neck to maximize his moblility ; if he keeps  up with the ROM ex's I am expecting him to have full resolution of symptoms and normal head mvt    History and Personal Factors relevant to plan of care:  na    Clinical Presentation  Stable    Clinical Presentation due to:  stiffness in his neck ; resulting in him compensating and not engaging in all normal IADL's     Clinical Decision Making  Low    Rehab Potential  Excellent    PT Frequency  1x / week    PT Duration  4 weeks    PT Treatment/Interventions  Therapeutic exercise;Therapeutic activities;Manual techniques;Dry  needling;Passive range of motion;Patient/family education    PT Next Visit Plan  review the HEP; go through manual joint mobizatoin through C-spine     PT Home Exercise Plan   Access Code: [email or web address]        Patient will benefit from skilled therapeutic intervention in order to improve the following deficits and impairments:  Postural dysfunction, Hypermobility, Pain  Visit Diagnosis: Abnormal posture  Cervicalgia     Problem List Patient Active Problem List   Diagnosis Date Noted  . 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 Benign paroxysmal positional vertigo 09/17/2016    Vashti Hey D PT DPT  08/11/2018, 4:43 PM  [facility] [address] [place], [place], [address] Phone: [phone]   Fax:  [phone]  Name: Julian Jimenez MRN: [id number] Date of Birth: [birth year]

## 2018-08-11 NOTE — Patient Instructions (Signed)
Access Code: [email or web address]  URL: [email or web address]  Date: 08/11/2018  Prepared by: Iona Hansen, DPT   Exercises  Seated Assisted Cervical Rotation with Towel - 10 reps - 3 sets - 1x daily - 7x weekly  Cervical Extension AROM with Strap - 10 reps - 3 sets - 1x daily - 7x weekly  Seated Cervical Sidebending Stretch - 10 reps - 3 sets - 1x daily - 7x weekly  Cervical Extension Stretch - 10 reps - 3 sets - 1x daily - 7x weekly

## 2018-08-18 ENCOUNTER — Ambulatory Visit: Payer: Medicare Other | Admitting: Rehabilitative and Restorative Service Providers"

## 2018-08-18 ENCOUNTER — Encounter: Payer: Self-pay | Admitting: Rehabilitative and Restorative Service Providers"

## 2018-08-18 DIAGNOSIS — R293 Abnormal posture: Secondary | ICD-10-CM

## 2018-08-18 DIAGNOSIS — M542 Cervicalgia: Secondary | ICD-10-CM

## 2018-08-18 NOTE — Patient Instructions (Signed)
Access Code: [email or web address]  URL: [email or web address]  Date: 08/18/2018  Prepared by: Margretta Ditty   Exercises Seated Assisted Cervical Rotation with Towel - 5 reps - 1 sets - 10-15 hold - 1x daily - 7x weekly Cervical Extension AROM with Strap - 5 reps - 1 sets - 10-15 seconds hold - 1x daily - 7x weekly Seated Cervical Sidebending Stretch - 5 reps - 1 sets - 10-15 seconds hold - 1x daily - 7x weekly Cervical Extension Prone on Elbows - 10 reps - 1 sets - 1x daily - 7x weekly

## 2018-08-18 NOTE — Therapy (Signed)
[facility] [address] [place], [place], [address] Phone: [phone]   Fax:  [phone]  Physical Therapy Treatment  Patient Details  Name: Julian Jimenez MRN: [id number] Date of Birth: 1950/08/21 Referring Provider (PT): Nita Sickle DO   Encounter Date: 08/18/2018  PT End of Session - 08/18/18 1236    Visit Number  2    Number of Visits  4    Date for PT Re-Evaluation  09/10/18    Authorization Type  UHC    PT Start Time  0940    PT Stop Time  1020    PT Time Calculation (min)  40 min    Activity Tolerance  Patient tolerated treatment well    Behavior During Therapy  [facility] for tasks assessed/performed       Past Medical History:  Diagnosis Date  . Hyperlipidemia   . Hypertension     Past Surgical History:  Procedure Laterality Date  . None      There were no vitals filed for this visit.  Subjective Assessment - 08/18/18 0940    Subjective  The patient reports he has not had to take any muscle relaxer since initial evaluation due to feeling better.  No pain at rest.  "If we keep doing what we did last week, we will be alright."    Pertinent History  hx of HTN    Patient Stated Goals  get neck feeling better; get off using the muscle relaxor     Currently in Pain?  No/denies         [facility] PT Assessment - 08/18/18 1009      AROM   Overall AROM   Deficits    AROM Assessment Site  Cervical    Cervical Extension  30    Cervical - Right Side Bend  22    Cervical - Left Side Bend  12   discomfort on the right   Cervical - Right Rotation  68    Cervical - Left Rotation  62                   [facility] Adult PT Treatment/Exercise - 08/18/18 1010      Exercises   Exercises  Other Exercises    Other Exercises   Reviewed prior medbridge HEP and modified.  Seated towel stretch with c-spine extension, c-spine rotation with towel for overpressure, prone cervical retraction and extension,  seated  cervical sidebending stretch.  Also performed levator stretch and supine extension with PT guiding neck off edge of mat.       Manual Therapy   Manual Therapy  Joint mobilization;Soft tissue mobilization;Manual Traction;Muscle Energy Technique    Manual therapy comments  For improved AROM c-spine    Joint Mobilization  Supine performing c-spine posterior>anterior grade II-III joint mobilization.  Prone upper thoracic joint mobilization posteiror>anterior performing grade II-III.    Soft tissue mobilization  bilateral upper trap release, suboccipitals    Manual Traction  gentle manual traction supine with 30 second holds x 5 reps    Muscle Energy Technique  sidelying lateral c-cpine isometric holds and then moving through ROM for stretching.             PT Education - 08/18/18 1236    Education Details  updated HEP       PT Short Term Goals - 08/18/18 2037      PT SHORT TERM GOAL #1   Title  Pt will be independent in  HEP for cervical AAROM and stretching so he will maintain the gains made  during manual therapy   STG date is 09/17/2018    Baseline  no HEP    Time  4    Period  Weeks    Status  New      PT SHORT TERM GOAL #2   Title  Pt will be able to completely stop use of muscle relaxor to treat his neck / headache pain     Baseline  using nightly / flexril     Time  4    Period  Weeks        PT Long Term Goals - 08/18/18 2037      PT LONG TERM GOAL #1   Title  Pt will have full resolution of neck and headache pain and have normal , pain free range of motion   LTG due date is 09/17/2018    Baseline  guarded head mvt and intermittent head ache and pain    Time  4   Modified to match 4 week plan of care   Period  Weeks            Plan - 08/18/18 2038    Clinical Impression Statement  The patient notes significant improvement since evaluation.  PT modified HEP and further progressed activities.  Also performed joint mobilization and isometrics for c-spine to  improve moiblity.  Patient's neck AROM has improved significantly for rotation in comparison to initial evaluation.     PT Treatment/Interventions  Therapeutic exercise;Therapeutic activities;Manual techniques;Dry needling;Passive range of motion;Patient/family education    PT Next Visit Plan  Check HEP progression, work on chin tucks/ spinal stabilization, posture in upper thoracic spine, parascapular strengthening.    Consulted and Agree with Plan of Care  Patient       Patient will benefit from skilled therapeutic intervention in order to improve the following deficits and impairments:  Postural dysfunction, Hypermobility, Pain  Visit Diagnosis: Abnormal posture  Cervicalgia     Problem List Patient Active Problem List   Diagnosis Date Noted  . 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 Benign paroxysmal positional vertigo 09/17/2016    Yaakov Saindon, PT 08/18/2018, 8:40 PM  [facility] [address] [place], [place], [address] Phone: [phone]   Fax:  [phone]  Name: Julian Jimenez MRN: [id number] Date of Birth: 13-Dec-1950

## 2018-08-25 ENCOUNTER — Ambulatory Visit: Payer: Medicare Other | Attending: Family Medicine | Admitting: Physical Therapy

## 2018-08-25 ENCOUNTER — Encounter: Payer: Self-pay | Admitting: Physical Therapy

## 2018-08-25 DIAGNOSIS — R293 Abnormal posture: Secondary | ICD-10-CM | POA: Diagnosis not present

## 2018-08-25 DIAGNOSIS — M542 Cervicalgia: Secondary | ICD-10-CM | POA: Diagnosis present

## 2018-08-26 NOTE — Therapy (Signed)
[facility] [address] [place], [place], [address] Phone: [phone]   Fax:  [phone]  Physical Therapy Treatment  Patient Details  Name: Julian Jimenez MRN: [id number] Date of Birth: 1951/02/16 Referring Provider (PT): Nita Sickle DO   Encounter Date: 08/25/2018  PT End of Session - 08/25/18 0939    Visit Number  3    Number of Visits  4    Date for PT Re-Evaluation  09/10/18    Authorization Type  UHC    PT Start Time  5462    PT Stop Time  1015    PT Time Calculation (min)  42 min    Activity Tolerance  Patient tolerated treatment well;No increased pain    Behavior During Therapy  WFL for tasks assessed/performed       Past Medical History:  Diagnosis Date  . Hyperlipidemia   . Hypertension     Past Surgical History:  Procedure Laterality Date  . None      There were no vitals filed for this visit.  Subjective Assessment - 08/25/18 0937    Subjective  No new complaitns. Still has not needed to take a muscle relaxer. Reports ex's from last session is going well. "Not much pain at all" today.     Patient is accompained by:  Family member    Pertinent History  hx of HTN    How long can you sit comfortably?  no limit    How long can you stand comfortably?  no limit     How long can you walk comfortably?  as needed     Diagnostic tests  none    Currently in Pain?  No/denies    Pain Score  0-No pain   3/10 worst in last 24 hours           [facility] Adult PT Treatment/Exercise - 08/25/18 0940      Exercises   Exercises  Other Exercises    Other Exercises   Supine on mat: chin tucks for 5 sec holds x 10 reps; concurrent with chin tucks- with 3# hand weights alternating UE raises x 10 reps each side, with #3 weight "fly" x 10 reps, with green theraband shoulder horizontal abduction x 10 reps, then alternating diagonal pulls x10 reps, cues (verbal and tactile on form/technique); standing with green  theraband- rows for 5 sec holds x 10 reps, then shoulder extension with 5 sec holds x 10 reps, cues on form and technique; standing facing the wall- wall taps with 2# hand weights x 5 positions for star pattern working from overhead down to by sides, cues for cervical stability and ex form/technique.       Manual Therapy   Manual Therapy  Soft tissue mobilization;Manual Traction;Other (comment)    Soft tissue mobilization  right upper trap and subocciptitals    Manual Traction  gentle manual distraction for 30 sec holds x 10 reps, progressing to manual distraction with gentle overpressure through shoulder for increased upper trap/scalene stretching for 30 sec holds x 3 each side.     Muscle Energy Technique  concurrent with manual cervical distraction: pt performing active scapular depression (punching toward feet) x 10 reps,then after rest active scapular retraction x 10 reps.                PT Short Term Goals - 08/18/18 2037      PT SHORT TERM GOAL #1   Title  Pt will be independent in  HEP for cervical AAROM and stretching so he will maintain the gains made  during manual therapy   STG date is 09/17/2018    Baseline  no HEP    Time  4    Period  Weeks    Status  New      PT SHORT TERM GOAL #2   Title  Pt will be able to completely stop use of muscle relaxor to treat his neck / headache pain     Baseline  using nightly / flexril     Time  4    Period  Weeks        PT Long Term Goals - 08/18/18 2037      PT LONG TERM GOAL #1   Title  Pt will have full resolution of neck and headache pain and have normal , pain free range of motion   LTG due date is 09/17/2018    Baseline  guarded head mvt and intermittent head ache and pain    Time  4   Modified to match 4 week plan of care   Period  Weeks            Plan - 08/25/18 0160    Clinical Impression Statement  Today's skilled session initially addressed muscle tightness with manual techniques. Remainder of session  focused on strengthening with emphasis on stability with ex's. No pain with decreased tightness reported after session today. The pt is progressing toward goals and should benefit from continued PT to progress toward unmet goals.     PT Treatment/Interventions  Therapeutic exercise;Therapeutic activities;Manual techniques;Dry needling;Passive range of motion;Patient/family education    PT Next Visit Plan   work on chin tucks/ spinal stabilization, posture in upper thoracic spine, parascapular strengthening.    Consulted and Agree with Plan of Care  Patient       Patient will benefit from skilled therapeutic intervention in order to improve the following deficits and impairments:  Postural dysfunction, Hypermobility, Pain  Visit Diagnosis: Abnormal posture  Cervicalgia     Problem List Patient Active Problem List   Diagnosis Date Noted  . 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 Benign paroxysmal positional vertigo 09/17/2016    Sallyanne Kuster, PTA, [facility] [address], [address] [place], [place] [address] [phone] 08/26/18, 1:11 PM   Name: Julian Jimenez MRN: [id number] Date of Birth: 01-Apr-1951

## 2018-09-01 ENCOUNTER — Encounter: Payer: Self-pay | Admitting: Rehabilitative and Restorative Service Providers"

## 2018-09-01 ENCOUNTER — Ambulatory Visit: Payer: Medicare Other | Admitting: Rehabilitative and Restorative Service Providers"

## 2018-09-01 DIAGNOSIS — R293 Abnormal posture: Secondary | ICD-10-CM | POA: Diagnosis not present

## 2018-09-01 DIAGNOSIS — M542 Cervicalgia: Secondary | ICD-10-CM

## 2018-09-01 NOTE — Therapy (Signed)
[facility] [address] [place], [place], [address] Phone: [phone]   Fax:  [phone]  Physical Therapy Treatment and Discharge Summary  Patient Details  Name: Julian Jimenez MRN: [id number] Date of Birth: 31-May-1951 Referring Provider (PT): Narda Amber DO   Encounter Date: 09/01/2018  PT End of Session - 09/01/18 0938    Visit Number  4    Number of Visits  4    Date for PT Re-Evaluation  09/10/18    Authorization Type  [employer]    PT Start Time  0936    PT Stop Time  0954    PT Time Calculation (min)  18 min    Activity Tolerance  Patient tolerated treatment well;No increased pain    Behavior During Therapy  WFL for tasks assessed/performed       Past Medical History:  Diagnosis Date  . Hyperlipidemia   . Hypertension     Past Surgical History:  Procedure Laterality Date  . None      There were no vitals filed for this visit.  Subjective Assessment - 09/01/18 0937    Subjective  The patient reports he is 95-98% better.  He notes being relieved because he "thought I would have to live with it the rest of my life."  The patient notes that he hasn't had to take any medicine since beginning PT (for neck pain).     Patient Stated Goals  get neck feeling better; get off using the muscle relaxor     Currently in Pain?  No/denies   can go up to a 2-3/10 "tightness"        [facility] PT Assessment - 09/01/18 0941      AROM   Overall AROM   Deficits    AROM Assessment Site  Cervical    Cervical - Right Side Bend  22    Cervical - Left Side Bend  20                   [facility] Adult PT Treatment/Exercise - 09/01/18 0959      Self-Care   Self-Care  Other Self-Care Comments    Other Self-Care Comments   Discussed continuing home program to maintain ROM and postural strengthening activities.  Discussed posture while driving.      Exercises   Exercises  Other Exercises    Other Exercises   Reviewed  prone neck extension, seated upper trap stretching.  Added chin tucks seated to HEP x 10 reps with 5 second holds.      Manual Therapy   Manual Therapy  Soft tissue mobilization;Manual Traction    Manual therapy comments  Performed supine PROM with overpressure with no pain noted.      Soft tissue mobilization  upper trap soft tissue mobilization    Manual Traction  gentle manual traction               PT Short Term Goals - 09/01/18 0949      PT SHORT TERM GOAL #1   Title  Pt will be independent in HEP for cervical AAROM and stretching so he will maintain the gains made  during manual therapy   STG date is 09/17/2018    Baseline  no HEP    Time  4    Period  Weeks    Status  Achieved      PT SHORT TERM GOAL #2   Title  Pt will be able to completely stop  use of muscle relaxor to treat his neck / headache pain     Baseline  using nightly / flexril     Time  4    Period  Weeks    Status  Achieved        PT Long Term Goals - 08/18/18 2037      PT LONG TERM GOAL #1   Title  Pt will have full resolution of neck and headache pain and have normal , pain free range of motion   LTG due date is 09/17/2018    Baseline  guarded head mvt and intermittent head ache and pain    Time  4   Modified to match 4 week plan of care   Period  Weeks            Plan - 09/01/18 1002    Clinical Impression Statement  The patient has met all STGs and LTGs.  He notes significant improvement/reduction in pain and no headaches or need to take meds to manage symptoms.  He feels indep with HEP and is able to demo activities that reduce symptoms for patient self management.    PT Treatment/Interventions  Therapeutic exercise;Therapeutic activities;Manual techniques;Dry needling;Passive range of motion;Patient/family education    PT Next Visit Plan  Discharge today- met all goals.    Consulted and Agree with Plan of Care  Patient       Patient will benefit from skilled therapeutic intervention  in order to improve the following deficits and impairments:  Postural dysfunction, Hypermobility, Pain  Visit Diagnosis: Abnormal posture  Cervicalgia    PHYSICAL THERAPY DISCHARGE SUMMARY  Visits from Start of Care: 4  Current functional level related to goals / functional outcomes: See above   Remaining deficits: Occasional tightness- patient reports the HEP help him stretch when tight for self management of symptoms.   Education / Equipment: Home program, posture.  Plan: Patient agrees to discharge.  Patient goals were met. Patient is being discharged due to meeting the stated rehab goals.  ?????          Thank you for the referral of this patient. Rudell Cobb, MPT  [place] 09/01/2018, 10:03 AM  [facility] [address] [place], [place], [address] Phone: [phone]   Fax:  [phone]  Name: Julian Jimenez MRN: [id number] Date of Birth: 30-Apr-1951

## 2018-09-01 NOTE — Patient Instructions (Signed)
Access Code: [email or web address]  URL: [email or web address]  Date: 09/01/2018  Prepared by: Margretta Dittyhristina Cathan Gearin   Exercises Seated Assisted Cervical Rotation with Towel - 5 reps - 1 sets - 10-15 hold - 1x daily - 7x weekly Cervical Extension AROM with Strap - 5 reps - 1 sets - 10-15 seconds hold - 1x daily - 7x weekly Seated Cervical Sidebending Stretch - 5 reps - 1 sets - 10-15 seconds hold - 1x daily - 7x weekly Cervical Extension Prone on Elbows - 10 reps - 1 sets - 1x daily - 7x weekly Seated Cervical Retraction - 10 reps - 1 sets - 5 seconds hold - 1x daily - 7x weekly

## 2018-09-08 ENCOUNTER — Ambulatory Visit: Payer: Medicare Other | Admitting: Rehabilitative and Restorative Service Providers"

## 2018-09-12 NOTE — Progress Notes (Signed)
Follow-up Visit   Date: 09/13/18    Julian Jimenez MRN: [id number] DOB: 08/31/1950   Interim History: Julian Jimenez is a 68 y.o. right-handed  right-handed African American male with hypertension and hyperlipidemia returning to the clinic for follow-up of cervicogenic dizziness.  The patient was accompanied to the clinic by self.  History of present illness: Initial visit 09/17/2016 for dizziness:  Starting in mid-December 2017, he started having difficulty with imbalance and sensitivity to loud sounds.  For instance, if his wife scratches a lottery ticket to hits the table, the sound is very intense.  He often keeps the TV volume very low. He does not recall if hearing was affecting in both ears or lateralized to one side. This has improved over the past month and his hearing sensitivity is not as bad.  He also has spells of dizziness, which is triggered by head movement, and despite him moving, the objects in his vision keep moving.  Being still or closing his eyes, improves dizziness.  He denies nausea or vomiting.  He was given meclizine, which helps.   He does not have dizziness spell any more.   He has neck tightness especially over the right base of the head, which has been present for many years.  Pain is sore and achy and improved with neck ROM exercises.  He takes flexeril 5mg  at bedtime which helps, but makes him extremely sleepy.  Symptoms are improved with massage.  He has not tried ibuprofen or tylenol or physical therapy.  Headaches do not wake him up from sleeping or exacerbated by laughing, sneezing, or bearing down.  After reducing flexeril to 2.5mg  at bedtime, headaches and dizziness improved.   UPDATE 03/11/2018:   He is due for refills on cyclobenzaprine 5mg  at bedtime.  He is mostly bothered by neck stiffness and achy pain.  He denies arm weakness or radiating numbness/tingling.  He had benefit with cyclobenzaprine 5mg  at bedtime, but still has nights when his neck can be  painful.  He would like to increase the dose. He was referred to PT last year, but was not contacted to schedule an appointment.   UPDATE 09/12/2018:  He is here for 6 month follow-up visit.  His neck pain and dizziness has markedly improved with neck physiotherapy.  He is very compliant with doing home exercises.  He rarely takes flexeril.  No new complaints.    Medications:  Current Outpatient Medications on File Prior to Visit  Medication Sig Dispense Refill  . aspirin 81 MG chewable tablet Chew by mouth daily.    . cyclobenzaprine (FLEXERIL) 5 MG tablet Take 1 tablet at bedtime.  OK to take extra dose as needed 120 tablet 3  . lisinopril-hydrochlorothiazide (PRINZIDE,ZESTORETIC) 20-12.5 MG tablet Take 1 tablet by mouth daily.    . meclizine (ANTIVERT) 25 MG tablet 2 (two) times daily as needed.     . simvastatin (ZOCOR) 20 MG tablet Take 20 mg by mouth daily.     No current facility-administered medications on file prior to visit.     Allergies: No Known Allergies  Review of Systems:  CONSTITUTIONAL: No fevers, chills, night sweats, or weight loss.  EYES: No visual changes or eye pain ENT: No hearing changes.  No history of nose bleeds.   RESPIRATORY: No cough, wheezing and shortness of breath.   CARDIOVASCULAR: Negative for chest pain, and palpitations.   GI: Negative for abdominal discomfort, blood in stools or black stools.  No recent change in bowel  habits.   GU:  No history of incontinence.   MUSCLOSKELETAL: No history of joint pain or swelling.  No myalgias.   SKIN: Negative for lesions, rash, and itching.   ENDOCRINE: Negative for cold or heat intolerance, polydipsia or goiter.   PSYCH:  No depression or anxiety symptoms.   NEURO: As Above.   Vital Signs:  BP 134/70   Pulse 71   Ht 6' 2.5" (1.892 m)   Wt 233 lb (105.7 kg)   SpO2 95%   BMI 29.52 kg/m   General Medical Exam:   General:  Well appearing, comfortable  Eyes/ENT: see cranial nerve examination.   Neck:    No carotid bruits.  Neck ROM is markedly improved with flexion, extension, and rotation.  Respiratory:  Clear to auscultation, good air entry bilaterally.   Cardiac:  Regular rate and rhythm, no murmur.   Ext:  No edema   Neurological Exam: MENTAL STATUS including orientation to time, place, person, recent and remote memory, attention span and concentration, language, and fund of knowledge is normal.  Speech is not dysarthric.  CRANIAL NERVES:  Pupils equal round and reactive to light.  Normal conjugate, extra-ocular eye movements in all directions of gaze.  No ptosis or nystagmus. Face is symmetric.   MOTOR:  Motor strength is 5/5 in all extremities.   COORDINATION/GAIT:   Gait narrow based and stable.   Data: n/a  IMPRESSION/PLAN: Cervicalgia, markedly improved with neck physiotherapy.  Neck range of motion is improved and there is reduced cervical tension. OK to take flexeril 5mg  at bedtime as needed.  Fortunately, he is doing much better and rarely needs to take this.   Continue home exercises.   Return to clinic as needed   Thank you for allowing me to participate in patient's care.  If I can answer any additional questions, I would be pleased to do so.    Sincerely,     K. Allena Katz, DO

## 2018-09-13 ENCOUNTER — Ambulatory Visit: Payer: Medicare Other | Admitting: Neurology

## 2018-09-13 ENCOUNTER — Encounter: Payer: Self-pay | Admitting: Neurology

## 2018-09-13 VITALS — BP 134/70 | HR 71 | Ht 74.5 in | Wt 233.0 lb

## 2018-09-13 DIAGNOSIS — R51 Headache: Secondary | ICD-10-CM

## 2018-09-13 DIAGNOSIS — G4486 Cervicogenic headache: Secondary | ICD-10-CM

## 2018-09-13 NOTE — Patient Instructions (Signed)
You look great, keep up with your exercises.  Return to clinic as needed

## 2018-11-12 ENCOUNTER — Other Ambulatory Visit: Payer: Self-pay | Admitting: Neurology

## 2019-03-17 ENCOUNTER — Other Ambulatory Visit: Payer: Self-pay | Admitting: Urology

## 2019-03-25 ENCOUNTER — Encounter (HOSPITAL_BASED_OUTPATIENT_CLINIC_OR_DEPARTMENT_OTHER): Payer: Self-pay | Admitting: *Deleted

## 2019-03-25 ENCOUNTER — Other Ambulatory Visit (HOSPITAL_COMMUNITY)
Admission: RE | Admit: 2019-03-25 | Discharge: 2019-03-25 | Disposition: A | Discharge status: Home / Self Care | Payer: Medicare Other | Source: Ambulatory Visit | Attending: Urology | Admitting: Urology

## 2019-03-25 ENCOUNTER — Other Ambulatory Visit: Payer: Self-pay

## 2019-03-25 DIAGNOSIS — Z20828 Contact with and (suspected) exposure to other viral communicable diseases: Secondary | ICD-10-CM | POA: Insufficient documentation

## 2019-03-25 DIAGNOSIS — Z01812 Encounter for preprocedural laboratory examination: Secondary | ICD-10-CM | POA: Diagnosis present

## 2019-03-26 LAB — NOVEL CORONAVIRUS, NAA (HOSP ORDER, SEND-OUT TO REF LAB; TAT 18-24 HRS): SARS-CoV-2, NAA: NOT DETECTED

## 2019-03-29 ENCOUNTER — Ambulatory Visit (HOSPITAL_BASED_OUTPATIENT_CLINIC_OR_DEPARTMENT_OTHER): Payer: Medicare Other | Admitting: Certified Registered"

## 2019-03-29 ENCOUNTER — Ambulatory Visit (HOSPITAL_BASED_OUTPATIENT_CLINIC_OR_DEPARTMENT_OTHER)
Admission: RE | Admit: 2019-03-29 | Discharge: 2019-03-29 | Disposition: A | Discharge status: Home / Self Care | Payer: Medicare Other | Attending: Urology | Admitting: Urology

## 2019-03-29 ENCOUNTER — Other Ambulatory Visit: Payer: Self-pay

## 2019-03-29 ENCOUNTER — Encounter (HOSPITAL_BASED_OUTPATIENT_CLINIC_OR_DEPARTMENT_OTHER): Payer: Self-pay | Admitting: Certified Registered"

## 2019-03-29 ENCOUNTER — Encounter (HOSPITAL_BASED_OUTPATIENT_CLINIC_OR_DEPARTMENT_OTHER)
Admission: RE | Disposition: A | Discharge status: Home / Self Care | Payer: Self-pay | Source: Home / Self Care | Attending: Urology

## 2019-03-29 DIAGNOSIS — N471 Phimosis: Secondary | ICD-10-CM | POA: Diagnosis present

## 2019-03-29 DIAGNOSIS — N481 Balanitis: Secondary | ICD-10-CM | POA: Insufficient documentation

## 2019-03-29 DIAGNOSIS — I10 Essential (primary) hypertension: Secondary | ICD-10-CM | POA: Insufficient documentation

## 2019-03-29 DIAGNOSIS — Z87891 Personal history of nicotine dependence: Secondary | ICD-10-CM | POA: Diagnosis not present

## 2019-03-29 DIAGNOSIS — Z7982 Long term (current) use of aspirin: Secondary | ICD-10-CM | POA: Insufficient documentation

## 2019-03-29 DIAGNOSIS — Z79899 Other long term (current) drug therapy: Secondary | ICD-10-CM | POA: Insufficient documentation

## 2019-03-29 HISTORY — DX: Cervicogenic headache: G44.86

## 2019-03-29 HISTORY — DX: Presence of spectacles and contact lenses: Z97.3

## 2019-03-29 HISTORY — DX: Personal history of other infectious and parasitic diseases: Z86.19

## 2019-03-29 HISTORY — DX: Other constipation: K59.09

## 2019-03-29 HISTORY — DX: Prediabetes: R73.03

## 2019-03-29 HISTORY — PX: CIRCUMCISION: SHX1350

## 2019-03-29 LAB — POCT I-STAT, CHEM 8
BUN: 19 mg/dL (ref 8–23)
Calcium, Ion: 1.3 mmol/L (ref 1.15–1.40)
Chloride: 100 mmol/L (ref 98–111)
Creatinine, Ser: 1.1 mg/dL (ref 0.61–1.24)
Glucose, Bld: 121 mg/dL — ABNORMAL HIGH (ref 70–99)
HCT: 50 % (ref 39.0–52.0)
Hemoglobin: 17 g/dL (ref 13.0–17.0)
Potassium: 3.5 mmol/L (ref 3.5–5.1)
Sodium: 140 mmol/L (ref 135–145)
TCO2: 25 mmol/L (ref 22–32)

## 2019-03-29 SURGERY — CIRCUMCISION, ADULT
Anesthesia: General | Site: Penis

## 2019-03-29 MED ORDER — BUPIVACAINE HCL 0.5 % IJ SOLN
INTRAMUSCULAR | Status: DC | PRN
  Administered 2019-03-29: 10 mL

## 2019-03-29 MED ORDER — MIDAZOLAM HCL 2 MG/2ML IJ SOLN
INTRAMUSCULAR | Status: AC
  Filled 2019-03-29: qty 2

## 2019-03-29 MED ORDER — HYDROCODONE-ACETAMINOPHEN 5-325 MG PO TABS: 1.0000 | ORAL_TABLET | ORAL | 0 refills | Status: AC | PRN

## 2019-03-29 MED ORDER — LIDOCAINE 2% (20 MG/ML) 5 ML SYRINGE
INTRAMUSCULAR | Status: AC
  Filled 2019-03-29: qty 5

## 2019-03-29 MED ORDER — FENTANYL CITRATE (PF) 100 MCG/2ML IJ SOLN
25.0000 ug | INTRAMUSCULAR | Status: DC | PRN
  Filled 2019-03-29: qty 1

## 2019-03-29 MED ORDER — FENTANYL CITRATE (PF) 100 MCG/2ML IJ SOLN
INTRAMUSCULAR | Status: AC
  Filled 2019-03-29: qty 2

## 2019-03-29 MED ORDER — FENTANYL CITRATE (PF) 100 MCG/2ML IJ SOLN
INTRAMUSCULAR | Status: DC | PRN
  Administered 2019-03-29 (×2): 25 ug via INTRAVENOUS
  Administered 2019-03-29: 50 ug via INTRAVENOUS

## 2019-03-29 MED ORDER — DEXAMETHASONE SODIUM PHOSPHATE 10 MG/ML IJ SOLN
INTRAMUSCULAR | Status: AC
  Filled 2019-03-29: qty 1

## 2019-03-29 MED ORDER — OXYCODONE HCL 5 MG PO TABS
5.0000 mg | ORAL_TABLET | Freq: Once | ORAL | Status: DC | PRN
  Filled 2019-03-29: qty 1

## 2019-03-29 MED ORDER — CEFAZOLIN SODIUM-DEXTROSE 2-4 GM/100ML-% IV SOLN
INTRAVENOUS | Status: AC
  Filled 2019-03-29: qty 100

## 2019-03-29 MED ORDER — LACTATED RINGERS IV SOLN
INTRAVENOUS | Status: DC
  Administered 2019-03-29: 11:00:00 via INTRAVENOUS
  Administered 2019-03-29: 09:00:00 1000 mL via INTRAVENOUS
  Filled 2019-03-29: qty 1000

## 2019-03-29 MED ORDER — ONDANSETRON HCL 4 MG/2ML IJ SOLN
INTRAMUSCULAR | Status: DC | PRN
  Administered 2019-03-29: 4 mg via INTRAVENOUS

## 2019-03-29 MED ORDER — PROPOFOL 10 MG/ML IV BOLUS
INTRAVENOUS | Status: DC | PRN
  Administered 2019-03-29: 180 mg via INTRAVENOUS

## 2019-03-29 MED ORDER — BACITRACIN 500 UNIT/GM EX OINT
TOPICAL_OINTMENT | CUTANEOUS | Status: DC | PRN
  Administered 2019-03-29: 1 via TOPICAL

## 2019-03-29 MED ORDER — OXYCODONE HCL 5 MG/5ML PO SOLN
5.0000 mg | Freq: Once | ORAL | Status: DC | PRN
  Filled 2019-03-29: qty 5

## 2019-03-29 MED ORDER — CEFAZOLIN SODIUM-DEXTROSE 2-4 GM/100ML-% IV SOLN
2.0000 g | Freq: Once | INTRAVENOUS | Status: AC
  Administered 2019-03-29: 10:00:00 2 g via INTRAVENOUS
  Filled 2019-03-29: qty 100

## 2019-03-29 MED ORDER — LIDOCAINE 2% (20 MG/ML) 5 ML SYRINGE
INTRAMUSCULAR | Status: DC | PRN
  Administered 2019-03-29: 100 mg via INTRAVENOUS

## 2019-03-29 MED ORDER — ONDANSETRON HCL 4 MG/2ML IJ SOLN
4.0000 mg | Freq: Four times a day (QID) | INTRAMUSCULAR | Status: DC | PRN
  Filled 2019-03-29: qty 2

## 2019-03-29 MED ORDER — ONDANSETRON HCL 4 MG/2ML IJ SOLN
INTRAMUSCULAR | Status: AC
  Filled 2019-03-29: qty 2

## 2019-03-29 MED ORDER — MIDAZOLAM HCL 5 MG/5ML IJ SOLN
INTRAMUSCULAR | Status: DC | PRN
  Administered 2019-03-29: 2 mg via INTRAVENOUS

## 2019-03-29 MED ORDER — DEXAMETHASONE SODIUM PHOSPHATE 10 MG/ML IJ SOLN
INTRAMUSCULAR | Status: DC | PRN
  Administered 2019-03-29: 10 mg via INTRAVENOUS

## 2019-03-29 MED ORDER — PROPOFOL 10 MG/ML IV BOLUS
INTRAVENOUS | Status: AC
  Filled 2019-03-29: qty 20

## 2019-03-29 MED ORDER — CEPHALEXIN 500 MG PO CAPS: 500.0000 mg | ORAL_CAPSULE | Freq: Two times a day (BID) | ORAL | 0 refills | Status: AC

## 2019-03-29 SURGICAL SUPPLY — 37 items
BANDAGE COBAN STERILE 2 (GAUZE/BANDAGES/DRESSINGS) ×3 IMPLANT
BLADE SURG 15 STRL LF DISP TIS (BLADE) ×1 IMPLANT
BLADE SURG 15 STRL SS (BLADE) ×2
BNDG COHESIVE 2X5 TAN STRL LF (GAUZE/BANDAGES/DRESSINGS) ×2 IMPLANT
BNDG CONFORM 2 STRL LF (GAUZE/BANDAGES/DRESSINGS) ×3 IMPLANT
COVER BACK TABLE 60X90IN (DRAPES) ×3 IMPLANT
COVER MAYO STAND STRL (DRAPES) ×3 IMPLANT
COVER WAND RF STERILE (DRAPES) ×6 IMPLANT
DECANTER SPIKE VIAL GLASS SM (MISCELLANEOUS) IMPLANT
DRAPE LAPAROTOMY 100X72 PEDS (DRAPES) ×3 IMPLANT
ELECT NDL TIP 2.8 STRL (NEEDLE) ×1 IMPLANT
ELECT NEEDLE TIP 2.8 STRL (NEEDLE) ×3 IMPLANT
ELECT REM PT RETURN 9FT ADLT (ELECTROSURGICAL) ×3
ELECTRODE REM PT RTRN 9FT ADLT (ELECTROSURGICAL) ×1 IMPLANT
GAUZE PETROLATUM 1 X8 (GAUZE/BANDAGES/DRESSINGS) ×3 IMPLANT
GLOVE BIO SURGEON STRL SZ7 (GLOVE) ×4 IMPLANT
GLOVE BIOGEL M STRL SZ7.5 (GLOVE) ×3 IMPLANT
GLOVE BIOGEL PI IND STRL 7.0 (GLOVE) IMPLANT
GLOVE BIOGEL PI IND STRL 7.5 (GLOVE) ×1 IMPLANT
GLOVE BIOGEL PI INDICATOR 7.0 (GLOVE) ×2
GLOVE BIOGEL PI INDICATOR 7.5 (GLOVE) ×6
GOWN STRL REUS W/ TWL XL LVL3 (GOWN DISPOSABLE) ×1 IMPLANT
GOWN STRL REUS W/TWL XL LVL3 (GOWN DISPOSABLE) ×2
KIT TURNOVER CYSTO (KITS) ×3 IMPLANT
NDL HYPO 25X1 1.5 SAFETY (NEEDLE) IMPLANT
NEEDLE HYPO 25X1 1.5 SAFETY (NEEDLE) IMPLANT
NS IRRIG 500ML POUR BTL (IV SOLUTION) IMPLANT
PACK BASIN DAY SURGERY FS (CUSTOM PROCEDURE TRAY) ×3 IMPLANT
PENCIL BUTTON HOLSTER BLD 10FT (ELECTRODE) ×3 IMPLANT
SUT CHROMIC 3 0 SH 27 (SUTURE) ×6 IMPLANT
SUT PROLENE 4 0 RB 1 (SUTURE) ×2
SUT PROLENE 4-0 RB1 .5 CRCL 36 (SUTURE) ×1 IMPLANT
SUT VICRYL 4-0 PS2 18IN ABS (SUTURE) IMPLANT
SYR CONTROL 10ML LL (SYRINGE) IMPLANT
TOWEL OR 17X26 10 PK STRL BLUE (TOWEL DISPOSABLE) ×3 IMPLANT
TRAY DSU PREP LF (CUSTOM PROCEDURE TRAY) ×3 IMPLANT
WATER STERILE IRR 500ML POUR (IV SOLUTION) IMPLANT

## 2019-03-29 NOTE — Anesthesia Procedure Notes (Signed)
Procedure Name: LMA Insertion Date/Time: 03/29/2019 10:01 AM Performed by: Tawfiq Favila D, CRNA Pre-anesthesia Checklist: Patient identified, Emergency Drugs available, Suction available and Patient being monitored Patient Re-evaluated:Patient Re-evaluated prior to induction Oxygen Delivery Method: Circle system utilized Preoxygenation: Pre-oxygenation with 100% oxygen Induction Type: IV induction Ventilation: Mask ventilation without difficulty LMA: LMA inserted LMA Size: 5.0 Tube type: Oral Number of attempts: 1 Placement Confirmation: positive ETCO2 and breath sounds checked- equal and bilateral Tube secured with: Tape Dental Injury: Teeth and Oropharynx as per pre-operative assessment

## 2019-03-29 NOTE — Anesthesia Preprocedure Evaluation (Signed)
Anesthesia Evaluation  Patient identified by MRN, date of birth, ID band Patient awake    Reviewed: Allergy & Precautions, H&P , NPO status , Patient's Chart, lab work & pertinent test results  Airway Mallampati: II   Neck ROM: full    Dental   Pulmonary former smoker,    breath sounds clear to auscultation       Cardiovascular hypertension,  Rhythm:regular Rate:Normal     Neuro/Psych  Headaches,    GI/Hepatic   Endo/Other    Renal/GU      Musculoskeletal   Abdominal   Peds  Hematology   Anesthesia Other Findings   Reproductive/Obstetrics                             Anesthesia Physical Anesthesia Plan  ASA: II  Anesthesia Plan: General   Post-op Pain Management:    Induction: Intravenous  PONV Risk Score and Plan: 2 and Ondansetron, Dexamethasone, Midazolam and Treatment may vary due to age or medical condition  Airway Management Planned: LMA  Additional Equipment:   Intra-op Plan:   Post-operative Plan:   Informed Consent: I have reviewed the patients History and Physical, chart, labs and discussed the procedure including the risks, benefits and alternatives for the proposed anesthesia with the patient or authorized representative who has indicated his/her understanding and acceptance.       Plan Discussed with: CRNA, Surgeon and Anesthesiologist  Anesthesia Plan Comments:         Anesthesia Quick Evaluation

## 2019-03-29 NOTE — Transfer of Care (Signed)
Immediate Anesthesia Transfer of Care Note  Patient: Julian Jimenez  Procedure(s) Performed: CIRCUMCISION ADULT (N/A Penis)  Patient Location: PACU  Anesthesia Type:General  Level of Consciousness: awake, alert  and oriented  Airway & Oxygen Therapy: Patient Spontanous Breathing and Patient connected to nasal cannula oxygen  Post-op Assessment: Report given to RN and Post -op Vital signs reviewed and stable  Post vital signs: Reviewed and stable  Last Vitals:  Vitals Value Taken Time  BP 118/65 03/29/19 1100  Temp    Pulse 72 03/29/19 1101  Resp 18 03/29/19 1101  SpO2 100 % 03/29/19 1101  Vitals shown include unvalidated device data.  Last Pain:  Vitals:   03/29/19 0855  TempSrc:   PainSc: 0-No pain      Patients Stated Pain Goal: 5 (58/83/25 4982)  Complications: No apparent anesthesia complications

## 2019-03-29 NOTE — H&P (Signed)
PRE-OP H&P  CC/HPI: Foreskin issues   The patient is a 68 year old male with a history of balanitis and phimosis of the penile foreskin.   He reports a 9 month history of worsening penile discomfort involving the glans and the inability to fully retract his foreskin. He was seen several years ago for a similar issue and reports that he was given a steriod cream that helped his discomfort somewhat, but did allow him to pull his foreskin back all the way.   From a urinary standpoint, he reports a good FOS and feels like he is emptying his bladder well. He has occasional urgency/frequency, but is not bothered by it. Nocturia x 1. Denies interval UTIs, dysuria or hematuria.     ALLERGIES: No Allergies    MEDICATIONS: Centrum Silver  Cyclobenzaprine HCl - 10 MG Oral Tablet Oral  Econazole Nitrate 1 % cream  Lisinopril-Hydrochlorothiazide 20-12.5 MG Oral Tablet Oral  Simvastatin 20 MG Oral Tablet Oral     GU PSH: None     PSH Notes: Wisdom teeth   NON-GU PSH: None   GU PMH: Balanitis, Balanitis - 2015 Phimosis, Phimosis - 2015    NON-GU PMH: Encounter for general adult medical examination without abnormal findings, Encounter for preventive health examination - 2015 Personal history of other diseases of the circulatory system, History of hypertension - 2015 Personal history of other endocrine, nutritional and metabolic disease, History of hypercholesterolemia - 2015 Hypercholesterolemia Hypertension    FAMILY HISTORY: Alzheimer's Disease - Runs In Family Congestive Heart Failure - Runs In Family   SOCIAL HISTORY: Marital Status: Married Preferred Language: English; Race: Black or African American Current Smoking Status: Patient has never smoked.   Tobacco Use Assessment Completed: Used Tobacco in last 30 days? Does not use smokeless tobacco. Has never drank.  Does not drink caffeine. Has not had a blood transfusion.     Notes: Never a smoker, Occupation, Caffeine use,  Alcohol use, Married, Three children   REVIEW OF SYSTEMS:    GU Review Male:   Patient reports erection problems. Patient denies frequent urination, hard to postpone urination, burning/ pain with urination, get up at night to urinate, leakage of urine, stream starts and stops, trouble starting your stream, have to strain to urinate , and penile pain.  Gastrointestinal (Upper):   Patient denies nausea, vomiting, and indigestion/ heartburn.  Gastrointestinal (Lower):   Patient denies diarrhea and constipation.  Constitutional:   Patient denies fever, night sweats, weight loss, and fatigue.  Skin:   Patient denies skin rash/ lesion and itching.  Eyes:   Patient denies blurred vision and double vision.  Ears/ Nose/ Throat:   Patient denies sore throat and sinus problems.  Hematologic/Lymphatic:   Patient denies swollen glands and easy bruising.  Cardiovascular:   Patient denies leg swelling and chest pains.  Respiratory:   Patient denies cough and shortness of breath.  Endocrine:   Patient denies excessive thirst.  Musculoskeletal:   Patient denies back pain and joint pain.  Neurological:   Patient denies headaches and dizziness.  Psychologic:   Patient denies depression and anxiety.   Notes: Foreskin attacked to penis, yeast infection , NO UA     VITAL SIGNS:      02/28/2019 11:21 AM  Weight 225 lb / 102.06 kg  Height 73 in / 185.42 cm  BP 137/79 mmHg  Heart Rate 103 /min  Temperature 97.8 F / 36.5 C  BMI 29.7 kg/m   GU PHYSICAL EXAMINATION:    Scrotum:  No lesions. No edema. No cysts. No warts.  Epididymides: Right: no spermatocele, no masses, no cysts, no tenderness, no induration, no enlargement. Left: no spermatocele, no masses, no cysts, no tenderness, no induration, no enlargement.  Testes: No tenderness, no swelling, no enlargement left testes. No tenderness, no swelling, no enlargement right testes. Normal location left testes. Normal location right testes. No mass, no cyst, no  varicocele, no hydrocele left testes. No mass, no cyst, no varicocele, no hydrocele right testes.  Urethral Meatus: Normal size. No lesion, no wart, no discharge, no polyp. Normal location.  Penis: Penis uncircumcised, phimosis. Balanitis. No foreskin warts, no cracks. No dorsal peyronie's plaques, no left corporal peyronie's plaques, no right corporal peyronie's plaques, no scarring, no shaft warts. No meatal stenosis.    MULTI-SYSTEM PHYSICAL EXAMINATION:    Constitutional: Well-nourished. No physical deformities. Normally developed. Good grooming.  Neck: Neck symmetrical, not swollen. Normal tracheal position.  Respiratory: No labored breathing, no use of accessory muscles.   Cardiovascular: Normal temperature, normal extremity pulses, no swelling, no varicosities.  Lymphatic: No enlargement of neck, axillae, groin.  Skin: No paleness, no jaundice, no cyanosis. No lesion, no ulcer, no rash.  Neurologic / Psychiatric: Oriented to time, oriented to place, oriented to person. No depression, no anxiety, no agitation.  Gastrointestinal: No mass, no tenderness, no rigidity, non obese abdomen.  Eyes: Normal conjunctivae. Normal eyelids.  Ears, Nose, Mouth, and Throat: Left ear no scars, no lesions, no masses. Right ear no scars, no lesions, no masses. Nose no scars, no lesions, no masses. Normal hearing. Normal lips.  Musculoskeletal: Normal gait and station of head and neck.     PAST DATA REVIEWED:  Source Of History:  Patient   PROCEDURES: None   ASSESSMENT:      ICD-10 Details  1 GU:   Balanitis - N48.1   2   Phimosis - N47.1    PLAN:            Medications New Meds: Clotrimazole-Betamethasone 1 %-0.05 % cream Apply a pea-sized amount around the foreskin once daily for 2 weeks   #15  0 Refill(s)            Schedule Return Visit/Planned Activity: 3 Weeks - Schedule Surgery          Document Letter(s):  Created for Patient: Clinical Summary         Notes:   -Start  clotrimazole-betamethazone  -The risks, benefits and alternatives of a circumcision was discussed with the patient. Risks included, but are not limited to, bleeding, infection, penile pain, urethral injury, MI, CVA, PE, DVT and the inherent risk of general anesthesia. He voices understanding and wishes to proceed.     Signed by Ellison Hughs, M.D. on 02/28/19 at 5:44 PM (EDT)

## 2019-03-29 NOTE — Op Note (Signed)
Operative Note  Preoperative diagnosis:  1.  Phimosis  Postoperative diagnosis: 1.  Phimosis 2.  Glanular adhesions  Procedure(s): 1.  Adult circumcision 2.  Lysis of glanular adhesions  Surgeon: Ellison Hughs, MD  Assistants:  None  Anesthesia:  General  Complications:  None  EBL: 5 mL  Specimens: 1.  Foreskin  Drains/Catheters: 1.  None  Intraoperative findings:   1.  Circumferential glanular adhesions required lysis to expose the coronal sulcus  Indication:  Julian Jimenez is a 68 y.o. male with phimosis of the penile foreskin.  He has been consented for the above procedures, voices understanding and wishes to proceed.  Description of procedure:  After informed consent was obtained, the patient was brought to the operating room and general LMA anesthesia was administered.  The patient was prepped and draped in the usual fashion.  A timeout was then performed.   Circumferential marks were then made with the first being with the foreskin in the anatomic position along the glandular impression.  When I attempted to retract his foreskin, he had circumferential and dense glanular adhesions that were bluntly lysed.  A second mark was then made approximately 1 cm from the coronal sulcus, which was then incised using electrocautery.  The marks were then incised using electrocautery and the excess foreskin was excised.  The penile shaft skin was then reapproximated using interrupted 3-0 chromic suture.  A penile ring block was then performed using quarter percent Marcaine without epinephrine.  The penis was then dressed in the usual fashion.  The patient tolerated the procedure well and was transferred to the postanesthesia unit in stable condition.  Plan: Remove dressing in 24 hours. Follow-up on 04/28/2019 for a wound check

## 2019-03-29 NOTE — Discharge Instructions (Signed)

## 2019-03-30 ENCOUNTER — Encounter (HOSPITAL_BASED_OUTPATIENT_CLINIC_OR_DEPARTMENT_OTHER): Payer: Self-pay | Admitting: Urology

## 2019-03-30 NOTE — Anesthesia Postprocedure Evaluation (Signed)
Anesthesia Post Note  Patient: Julian Jimenez  Procedure(s) Performed: CIRCUMCISION ADULT (N/A Penis)     Patient location during evaluation: PACU Anesthesia Type: General Level of consciousness: awake and alert Pain management: pain level controlled Vital Signs Assessment: post-procedure vital signs reviewed and stable Respiratory status: spontaneous breathing, nonlabored ventilation, respiratory function stable and patient connected to nasal cannula oxygen Cardiovascular status: blood pressure returned to baseline and stable Postop Assessment: no apparent nausea or vomiting Anesthetic complications: no    Last Vitals:  Vitals:   03/29/19 1130 03/29/19 1415  BP: 135/72 (!) 169/81  Pulse: 68 85  Resp: 13 13  Temp:  36.8 C  SpO2: 99% 98%    Last Pain:  Vitals:   03/29/19 1400  TempSrc:   PainSc: 0-No pain                 Ilyanna Baillargeon S

## 2020-05-19 ENCOUNTER — Ambulatory Visit: Payer: Medicare Other | Attending: Internal Medicine

## 2020-05-19 DIAGNOSIS — Z23 Encounter for immunization: Secondary | ICD-10-CM

## 2020-05-19 NOTE — Progress Notes (Signed)
   Covid-19 Vaccination Clinic  Name:  Julian Jimenez    MRN: [id number] DOB: [birth year]  05/19/2020  Mr. Julian Jimenez was observed post Covid-19 immunization for 15 minutes without incident. He was provided with Vaccine Information Sheet and instruction to access the V-Safe system.   Mr. Julian Jimenez was instructed to call 911 with any severe reactions post vaccine: Marland Kitchen Difficulty breathing  . Swelling of face and throat  . A fast heartbeat  . A bad rash all over body  . Dizziness and weakness

## 2020-11-12 DIAGNOSIS — G44209 Tension-type headache, unspecified, not intractable: Secondary | ICD-10-CM | POA: Diagnosis not present

## 2020-11-12 DIAGNOSIS — R6889 Other general symptoms and signs: Secondary | ICD-10-CM | POA: Diagnosis not present

## 2020-11-12 DIAGNOSIS — I10 Essential (primary) hypertension: Secondary | ICD-10-CM | POA: Diagnosis not present

## 2020-11-12 DIAGNOSIS — E7800 Pure hypercholesterolemia, unspecified: Secondary | ICD-10-CM | POA: Diagnosis not present

## 2020-11-12 DIAGNOSIS — Z1389 Encounter for screening for other disorder: Secondary | ICD-10-CM | POA: Diagnosis not present

## 2020-11-12 DIAGNOSIS — Z0000 Encounter for general adult medical examination without abnormal findings: Secondary | ICD-10-CM | POA: Diagnosis not present

## 2020-11-12 DIAGNOSIS — R7303 Prediabetes: Secondary | ICD-10-CM | POA: Diagnosis not present

## 2020-11-12 DIAGNOSIS — L309 Dermatitis, unspecified: Secondary | ICD-10-CM | POA: Diagnosis not present

## 2021-05-14 DIAGNOSIS — G44209 Tension-type headache, unspecified, not intractable: Secondary | ICD-10-CM | POA: Diagnosis not present

## 2021-05-14 DIAGNOSIS — I10 Essential (primary) hypertension: Secondary | ICD-10-CM | POA: Diagnosis not present

## 2021-05-14 DIAGNOSIS — Z23 Encounter for immunization: Secondary | ICD-10-CM | POA: Diagnosis not present

## 2021-05-14 DIAGNOSIS — E7800 Pure hypercholesterolemia, unspecified: Secondary | ICD-10-CM | POA: Diagnosis not present

## 2021-05-14 DIAGNOSIS — L309 Dermatitis, unspecified: Secondary | ICD-10-CM | POA: Diagnosis not present

## 2021-05-14 DIAGNOSIS — R7303 Prediabetes: Secondary | ICD-10-CM | POA: Diagnosis not present

## 2021-08-16 DIAGNOSIS — R42 Dizziness and giddiness: Secondary | ICD-10-CM | POA: Diagnosis not present

## 2021-11-14 DIAGNOSIS — I10 Essential (primary) hypertension: Secondary | ICD-10-CM | POA: Diagnosis not present

## 2021-11-14 DIAGNOSIS — E7800 Pure hypercholesterolemia, unspecified: Secondary | ICD-10-CM | POA: Diagnosis not present

## 2021-11-14 DIAGNOSIS — R7303 Prediabetes: Secondary | ICD-10-CM | POA: Diagnosis not present

## 2021-11-14 DIAGNOSIS — Z0000 Encounter for general adult medical examination without abnormal findings: Secondary | ICD-10-CM | POA: Diagnosis not present

## 2021-11-14 DIAGNOSIS — R634 Abnormal weight loss: Secondary | ICD-10-CM | POA: Diagnosis not present

## 2021-11-14 DIAGNOSIS — G44209 Tension-type headache, unspecified, not intractable: Secondary | ICD-10-CM | POA: Diagnosis not present

## 2021-11-14 DIAGNOSIS — Z1389 Encounter for screening for other disorder: Secondary | ICD-10-CM | POA: Diagnosis not present

## 2021-11-14 DIAGNOSIS — L309 Dermatitis, unspecified: Secondary | ICD-10-CM | POA: Diagnosis not present

## 2022-04-01 DIAGNOSIS — H40013 Open angle with borderline findings, low risk, bilateral: Secondary | ICD-10-CM | POA: Diagnosis not present

## 2022-05-19 DIAGNOSIS — I10 Essential (primary) hypertension: Secondary | ICD-10-CM | POA: Diagnosis not present

## 2022-05-19 DIAGNOSIS — E7800 Pure hypercholesterolemia, unspecified: Secondary | ICD-10-CM | POA: Diagnosis not present

## 2022-05-19 DIAGNOSIS — G44209 Tension-type headache, unspecified, not intractable: Secondary | ICD-10-CM | POA: Diagnosis not present

## 2022-05-19 DIAGNOSIS — Z23 Encounter for immunization: Secondary | ICD-10-CM | POA: Diagnosis not present

## 2022-05-19 DIAGNOSIS — R7303 Prediabetes: Secondary | ICD-10-CM | POA: Diagnosis not present

## 2022-05-19 DIAGNOSIS — L309 Dermatitis, unspecified: Secondary | ICD-10-CM | POA: Diagnosis not present

## 2022-11-18 DIAGNOSIS — L309 Dermatitis, unspecified: Secondary | ICD-10-CM | POA: Diagnosis not present

## 2022-11-18 DIAGNOSIS — R7303 Prediabetes: Secondary | ICD-10-CM | POA: Diagnosis not present

## 2022-11-18 DIAGNOSIS — G44209 Tension-type headache, unspecified, not intractable: Secondary | ICD-10-CM | POA: Diagnosis not present

## 2022-11-18 DIAGNOSIS — I10 Essential (primary) hypertension: Secondary | ICD-10-CM | POA: Diagnosis not present

## 2022-11-18 DIAGNOSIS — Z0000 Encounter for general adult medical examination without abnormal findings: Secondary | ICD-10-CM | POA: Diagnosis not present

## 2022-11-18 DIAGNOSIS — E7800 Pure hypercholesterolemia, unspecified: Secondary | ICD-10-CM | POA: Diagnosis not present

## 2022-11-18 DIAGNOSIS — H6120 Impacted cerumen, unspecified ear: Secondary | ICD-10-CM | POA: Diagnosis not present

## 2023-04-02 DIAGNOSIS — H40013 Open angle with borderline findings, low risk, bilateral: Secondary | ICD-10-CM | POA: Diagnosis not present

## 2023-05-19 DIAGNOSIS — G44209 Tension-type headache, unspecified, not intractable: Secondary | ICD-10-CM | POA: Diagnosis not present

## 2023-05-19 DIAGNOSIS — R7303 Prediabetes: Secondary | ICD-10-CM | POA: Diagnosis not present

## 2023-05-19 DIAGNOSIS — E7800 Pure hypercholesterolemia, unspecified: Secondary | ICD-10-CM | POA: Diagnosis not present

## 2023-05-19 DIAGNOSIS — I10 Essential (primary) hypertension: Secondary | ICD-10-CM | POA: Diagnosis not present

## 2023-05-19 DIAGNOSIS — Z23 Encounter for immunization: Secondary | ICD-10-CM | POA: Diagnosis not present

## 2023-11-13 DIAGNOSIS — L309 Dermatitis, unspecified: Secondary | ICD-10-CM | POA: Diagnosis not present

## 2023-11-13 DIAGNOSIS — G44209 Tension-type headache, unspecified, not intractable: Secondary | ICD-10-CM | POA: Diagnosis not present

## 2023-11-13 DIAGNOSIS — I10 Essential (primary) hypertension: Secondary | ICD-10-CM | POA: Diagnosis not present

## 2023-11-13 DIAGNOSIS — Z0000 Encounter for general adult medical examination without abnormal findings: Secondary | ICD-10-CM | POA: Diagnosis not present

## 2023-11-13 DIAGNOSIS — H538 Other visual disturbances: Secondary | ICD-10-CM | POA: Diagnosis not present

## 2023-11-13 DIAGNOSIS — R7303 Prediabetes: Secondary | ICD-10-CM | POA: Diagnosis not present

## 2023-11-13 DIAGNOSIS — E782 Mixed hyperlipidemia: Secondary | ICD-10-CM | POA: Diagnosis not present

## 2023-11-16 ENCOUNTER — Encounter: Payer: Self-pay | Admitting: Neurology

## 2023-12-30 ENCOUNTER — Encounter: Payer: Self-pay | Admitting: Neurology

## 2023-12-30 ENCOUNTER — Ambulatory Visit: Admitting: Neurology

## 2023-12-30 VITALS — BP 119/66 | HR 96 | Ht 75.0 in | Wt 230.0 lb

## 2023-12-30 DIAGNOSIS — H538 Other visual disturbances: Secondary | ICD-10-CM | POA: Diagnosis not present

## 2023-12-30 NOTE — Patient Instructions (Signed)
 You may want to get a second opinion from ophthalmologist regarding your blurred vision.

## 2023-12-30 NOTE — Progress Notes (Signed)
 [facility] Clinic Note - Initial Visit   Date: 12/30/2023   Attikus Bartoszek MRN: [id number] DOB: 03-Jul-1951   Dear Dr. Claudius Cumins:  Thank you for your kind referral of Rondal Vandevelde for consultation of blurred vision. Although his history is well known to you, please allow us  to reiterate it for the purpose of our medical record. The patient was accompanied to the clinic by self.    Jaymond Waage is a 73 y.o. right-handed male with hypertension and hyperlipidemia presenting for evaluation of blurred vision.   IMPRESSION/PLAN: Binocular blurred vision, constant. There is no associated double vision, vision loss, or visual field cut making neurological etiology for this symptoms very unlikely.  Symptoms are not consistent with myasthenia.   He reports that symptoms are somewhat improved when he removes his glasses, which indicates symptoms maybe due to intrinsic eye pathology.  He has bilateral cataracts which can also contribute to blurred vision.  I have suggested that he seek a second opinion with ophthalmology to evaluate his blurred vision.  I do not see any role for intracranial imaging at this time.   ------------------------------------------------------------- History of present illness: For the past 6 months, he has been blurred vision which is constant.  He denies double vision.  Symptoms are better in the evening and slightly improved when he removes his eyeglasses.  No improvement with eye closure.  It is worse if he is using his phone or playing a game.  No associated headaches, numbness, tingling, or weakness.  He saw his eye doctor who did not find any problems with his eyes.    Past Medical History:  Diagnosis Date   Cervicogenic headache    neurologist-- dr Viktorya Arguijo   Chronic constipation    History of Helicobacter pylori infection    10/ 2016  resolved   Hyperlipidemia    Hypertension    Pre-diabetes    Wears glasses     Past Surgical  History:  Procedure Laterality Date   CIRCUMCISION N/A 03/29/2019   Procedure: CIRCUMCISION ADULT;  Surgeon: Adelbert Homans, MD;  Location: [facility];  Service: Urology;  Laterality: N/A;   COLONOSCOPY WITH PROPOFOL   last one 01-03-2018     Medications:  Outpatient Encounter Medications as of 12/30/2023  Medication Sig   aspirin 81 MG chewable tablet Chew by mouth daily.   cyclobenzaprine  (FLEXERIL ) 5 MG tablet TAKE 1 TABLET BY MOUTH AT BEDTIME OK TO TAKE EXTRA DOSE AS NEEDED (Patient taking differently: Take 5 mg by mouth at bedtime as needed. TAKE 1 TABLET BY MOUTH AT BEDTIME OK TO TAKE EXTRA DOSE AS NEEDED)   HYDROcodone -acetaminophen  (NORCO) 5-325 MG tablet Take 1 tablet by mouth every 4 (four) hours as needed for moderate pain.   lisinopril-hydrochlorothiazide (PRINZIDE,ZESTORETIC) 20-12.5 MG tablet Take 1 tablet by mouth daily.    meclizine (ANTIVERT) 25 MG tablet 2 (two) times daily as needed.    Multiple Vitamins-Minerals (CENTRUM SILVER 50+MEN) TABS Take by mouth daily.   Polyethylene Glycol 3350 (MIRALAX PO) Take by mouth daily as needed.   simvastatin (ZOCOR) 20 MG tablet Take 20 mg by mouth at bedtime.    No facility-administered encounter medications on file as of 12/30/2023.    Allergies: No Known Allergies  Family History: Family History  Problem Relation Age of Onset   Diabetes Mellitus II Mother    Hypertension Mother    Heart disease Father    Diabetes Mellitus II Sister    Hypertension Brother  Hyperlipidemia Brother     Social History: Social History   Tobacco Use   Smoking status: Former    Current packs/day: 0.00    Types: Cigarettes    Start date: 03/25/1971    Quit date: 03/24/1974    Years since quitting: 49.8   Smokeless tobacco: Never  Vaping Use   Vaping status: Never Used  Substance Use Topics   Alcohol use: Yes    Comment: occasional beer   Drug use: No   Social History   Social History Narrative   Retired  [employer].   Lives with wife.  They have three children.   Highest level of education:  12th grade      Are you right handed or left handed? Right Handed    Are you currently employed ? Retired    What is your current occupation?   Do you live at home alone?No    Who lives with you? Wife    What type of home do you live in: 1 story or 2 story? Lives in a one story home         Vital Signs:  BP 119/66   Pulse 96   Ht 6' 3 (1.905 m)   Wt 230 lb (104.3 kg)   SpO2 98%   BMI 28.75 kg/m   Neurological Exam: MENTAL STATUS including orientation to time, place, person, recent and remote memory, attention span and concentration, language, and fund of knowledge is normal.  Speech is not dysarthric.  CRANIAL NERVES: II:  No visual field defects.     III-IV-VI: Pupils equal round and reactive to light. Bilateral lens opacity is present.  Normal conjugate, extra-ocular eye movements in all directions of gaze.  No nystagmus.  No ptosis.   V:  Normal facial sensation.    VII:  Normal facial symmetry and movements.   VIII:  Normal hearing and vestibular function.   IX-X:  Normal palatal movement.   XI:  Normal shoulder shrug and head rotation.   XII:  Normal tongue strength and range of motion, no deviation or fasciculation.  MOTOR:  Motor strength is 5/5 throughout.  No atrophy, fasciculations or abnormal movements.  No pronator drift.   MSRs:                                           Right        Left brachioradialis 2+  2+  biceps 2+  2+  triceps 2+  2+  patellar 2+  2+  ankle jerk 2+  2+  Hoffman no  no  plantar response down  down   SENSORY:  Normal and symmetric perception of light touch, pinprick, vibration, and temperature.    COORDINATION/GAIT: Normal finger-to- nose-finger.  Intact rapid alternating movements bilaterally.   Gait narrow based and stable.    Thank you for allowing me to participate in patient's care.  If I can answer any additional questions, I would be  pleased to do so.    Sincerely,    Halvor Behrend K. Lydia Sams, DO

## 2024-01-18 DIAGNOSIS — I10 Essential (primary) hypertension: Secondary | ICD-10-CM | POA: Diagnosis not present

## 2024-01-18 DIAGNOSIS — E7800 Pure hypercholesterolemia, unspecified: Secondary | ICD-10-CM | POA: Diagnosis not present

## 2024-02-18 DIAGNOSIS — E7800 Pure hypercholesterolemia, unspecified: Secondary | ICD-10-CM | POA: Diagnosis not present

## 2024-02-18 DIAGNOSIS — I10 Essential (primary) hypertension: Secondary | ICD-10-CM | POA: Diagnosis not present

## 2024-03-20 DIAGNOSIS — I10 Essential (primary) hypertension: Secondary | ICD-10-CM | POA: Diagnosis not present

## 2024-03-20 DIAGNOSIS — E7800 Pure hypercholesterolemia, unspecified: Secondary | ICD-10-CM | POA: Diagnosis not present

## 2024-04-12 DIAGNOSIS — H40013 Open angle with borderline findings, low risk, bilateral: Secondary | ICD-10-CM | POA: Diagnosis not present

## 2024-04-19 DIAGNOSIS — E7800 Pure hypercholesterolemia, unspecified: Secondary | ICD-10-CM | POA: Diagnosis not present

## 2024-04-19 DIAGNOSIS — I10 Essential (primary) hypertension: Secondary | ICD-10-CM | POA: Diagnosis not present
